# Patient Record
Sex: Female | Born: 1960 | Race: White | Hispanic: No | Marital: Married | Smoking: Never smoker
Health system: Southern US, Community
[De-identification: ages and names within clinical notes are randomized; demographics above are authoritative.]

## PROBLEM LIST (undated history)

## (undated) DIAGNOSIS — Z460 Encounter for fitting and adjustment of spectacles and contact lenses: Secondary | ICD-10-CM

## (undated) DIAGNOSIS — I10 Essential (primary) hypertension: Secondary | ICD-10-CM

## (undated) HISTORY — PX: DIAGNOSTIC LAPAROSCOPY: SUR761

## (undated) HISTORY — PX: WISDOM TOOTH EXTRACTION: SHX21

## (undated) HISTORY — PX: DILATION AND CURETTAGE OF UTERUS: SHX78

## (undated) HISTORY — PX: OTHER SURGICAL HISTORY: SHX169

---

## 1998-06-12 ENCOUNTER — Emergency Department (HOSPITAL_COMMUNITY): Admission: EM | Admit: 1998-06-12 | Discharge: 1998-06-13 | Payer: Self-pay | Admitting: Emergency Medicine

## 2001-06-15 ENCOUNTER — Emergency Department (HOSPITAL_COMMUNITY): Admission: EM | Admit: 2001-06-15 | Discharge: 2001-06-15 | Payer: Self-pay | Admitting: Emergency Medicine

## 2007-05-17 ENCOUNTER — Emergency Department (HOSPITAL_COMMUNITY): Admission: EM | Admit: 2007-05-17 | Discharge: 2007-05-17 | Payer: Self-pay | Admitting: Emergency Medicine

## 2007-12-11 ENCOUNTER — Other Ambulatory Visit: Admission: RE | Admit: 2007-12-11 | Discharge: 2007-12-11 | Payer: Self-pay | Admitting: *Deleted

## 2011-01-18 ENCOUNTER — Other Ambulatory Visit: Payer: Self-pay | Admitting: Family Medicine

## 2011-01-20 ENCOUNTER — Other Ambulatory Visit: Payer: Self-pay

## 2011-01-20 ENCOUNTER — Ambulatory Visit
Admission: RE | Admit: 2011-01-20 | Discharge: 2011-01-20 | Disposition: A | Discharge status: Home / Self Care | Payer: BC Managed Care – PPO | Source: Ambulatory Visit | Attending: Family Medicine | Admitting: Family Medicine

## 2011-01-26 ENCOUNTER — Ambulatory Visit
Admission: RE | Admit: 2011-01-26 | Discharge: 2011-01-26 | Disposition: A | Discharge status: Home / Self Care | Payer: BC Managed Care – PPO | Source: Ambulatory Visit | Attending: Family Medicine | Admitting: Family Medicine

## 2011-01-26 ENCOUNTER — Other Ambulatory Visit: Payer: Self-pay | Admitting: Family Medicine

## 2011-01-26 DIAGNOSIS — R109 Unspecified abdominal pain: Secondary | ICD-10-CM

## 2011-01-27 ENCOUNTER — Other Ambulatory Visit (HOSPITAL_COMMUNITY): Payer: Self-pay | Admitting: Family Medicine

## 2011-01-27 DIAGNOSIS — R1011 Right upper quadrant pain: Secondary | ICD-10-CM

## 2011-01-27 DIAGNOSIS — K828 Other specified diseases of gallbladder: Secondary | ICD-10-CM

## 2011-01-27 DIAGNOSIS — R109 Unspecified abdominal pain: Secondary | ICD-10-CM

## 2011-02-09 ENCOUNTER — Encounter (HOSPITAL_COMMUNITY)
Admission: RE | Admit: 2011-02-09 | Discharge: 2011-02-09 | Disposition: A | Discharge status: Home / Self Care | Payer: BC Managed Care – PPO | Source: Ambulatory Visit | Attending: Family Medicine | Admitting: Family Medicine

## 2011-02-09 ENCOUNTER — Encounter (HOSPITAL_COMMUNITY): Payer: Self-pay

## 2011-02-09 DIAGNOSIS — R109 Unspecified abdominal pain: Secondary | ICD-10-CM

## 2011-02-09 DIAGNOSIS — R1011 Right upper quadrant pain: Secondary | ICD-10-CM | POA: Insufficient documentation

## 2011-02-09 DIAGNOSIS — K828 Other specified diseases of gallbladder: Secondary | ICD-10-CM

## 2011-02-09 MED ORDER — TECHNETIUM TC 99M MEBROFENIN IV KIT
5.5000 | PACK | Freq: Once | INTRAVENOUS | Status: AC | PRN
  Administered 2011-02-09: 5.5 via INTRAVENOUS

## 2011-02-09 MED ORDER — SINCALIDE 5 MCG IJ SOLR: 0.0200 ug/kg | Freq: Once | INTRAMUSCULAR | Status: DC

## 2011-08-03 ENCOUNTER — Other Ambulatory Visit: Payer: Self-pay | Admitting: Obstetrics and Gynecology

## 2011-08-03 DIAGNOSIS — Z1231 Encounter for screening mammogram for malignant neoplasm of breast: Secondary | ICD-10-CM

## 2011-08-23 ENCOUNTER — Ambulatory Visit
Admission: RE | Admit: 2011-08-23 | Discharge: 2011-08-23 | Disposition: A | Discharge status: Home / Self Care | Payer: BC Managed Care – PPO | Source: Ambulatory Visit | Attending: Obstetrics and Gynecology | Admitting: Obstetrics and Gynecology

## 2011-08-23 DIAGNOSIS — Z1231 Encounter for screening mammogram for malignant neoplasm of breast: Secondary | ICD-10-CM

## 2011-08-24 LAB — POCT I-STAT CREATININE
Creatinine, Ser: 0.8
Operator id: 285841

## 2011-08-24 LAB — I-STAT 8, (EC8 V) (CONVERTED LAB)
BUN: 13
Chloride: 104
Glucose, Bld: 89
Hemoglobin: 12.6
Potassium: 4
Sodium: 138

## 2011-08-24 LAB — LIPASE, BLOOD: Lipase: 30

## 2011-08-24 LAB — CBC
HCT: 34.5 — ABNORMAL LOW
MCV: 87.8
Platelets: 502 — ABNORMAL HIGH
RDW: 13.4

## 2011-08-24 LAB — DIFFERENTIAL
Basophils Absolute: 0
Eosinophils Absolute: 0.2
Eosinophils Relative: 3
Neutrophils Relative %: 65

## 2011-09-08 ENCOUNTER — Other Ambulatory Visit: Payer: Self-pay | Admitting: Obstetrics and Gynecology

## 2011-09-09 HISTORY — PX: ABDOMINAL HYSTERECTOMY: SHX81

## 2011-09-24 ENCOUNTER — Encounter (HOSPITAL_COMMUNITY): Payer: Self-pay

## 2011-09-27 ENCOUNTER — Encounter (HOSPITAL_COMMUNITY)
Admission: RE | Admit: 2011-09-27 | Discharge: 2011-09-27 | Disposition: A | Discharge status: Home / Self Care | Payer: BC Managed Care – PPO | Source: Ambulatory Visit | Attending: Obstetrics and Gynecology | Admitting: Obstetrics and Gynecology

## 2011-09-27 ENCOUNTER — Encounter (HOSPITAL_COMMUNITY): Payer: Self-pay

## 2011-09-27 HISTORY — DX: Essential (primary) hypertension: I10

## 2011-09-27 LAB — SURGICAL PCR SCREEN
MRSA, PCR: NEGATIVE
Staphylococcus aureus: POSITIVE — AB

## 2011-09-27 LAB — CBC
MCV: 87.8 fL (ref 78.0–100.0)
Platelets: 442 10*3/uL — ABNORMAL HIGH (ref 150–400)
RDW: 14.7 % (ref 11.5–15.5)
WBC: 10.9 10*3/uL — ABNORMAL HIGH (ref 4.0–10.5)

## 2011-09-27 NOTE — Patient Instructions (Signed)
   Your procedure is scheduled on: Thursday, Nov 29th  Enter through the Main Entrance of [facility] at: 12:00 noon Pick up the phone at the desk and dial [phone] and inform us of your arrival.  Please call this number if you have any problems the morning of surgery: [phone]  Remember: Do not eat food after midnight: Wednesday Do not drink clear liquids after: 9:30am Thursday Take these medicines the morning of surgery with a SIP OF WATER:  Do not wear jewelry, make-up, or FINGER nail polish Do not wear lotions, powders, or perfumes.  You may not  wear deodorant. Do not shave 48 hours prior to surgery. Do not bring valuables to the hospital.  Leave suitcase in the car. After Surgery it may be brought to your room. For patients being admitted to the hospital, checkout time is 11:00am the day of discharge.  Patients discharged on the day of surgery will not be allowed to drive home.    Remember to use your hibiclens as instructed.Please shower with 1/2 bottle the evening before your surgery and the other 1/2 bottle the morning of surgery.

## 2011-09-27 NOTE — Pre-Procedure Instructions (Signed)
Ok to see patient DOS. 

## 2011-10-05 NOTE — H&P (Signed)
NAME:  [name], [name]                    ACCOUNT NO.:  MEDICAL RECORD NO.:  [id number]  LOCATION:                                 FACILITY:  PHYSICIAN:  Dois Davenport A. Rivard, M.D. DATE OF BIRTH:  Feb 27, 1961  DATE OF ADMISSION: DATE OF DISCHARGE:                             HISTORY & PHYSICAL   HISTORY OF PRESENT ILLNESS:  Ms. Holdsworth is a 50 year old married white female, para 0-0-1-0, presenting for a robot-assisted supracervical hysterectomy because of symptomatic uterine fibroids and menorrhagia. The patient states that she has always had "heavy menstrual periods," but starting in April 2012, her menses became much heavier.  Over the course of the past 6 months, the patient's flow may last from 7 days to 3 weeks, requiring her to change a pad hourly on the heaviest days. Typically, the patient may use up to 8 pads per day, experience significant pelvic and lower back pain that she rates as an 8/10 on a 10- point pain scale.  Fortunately, the patient is able to find relief with ibuprofen 800 mg, which will decrease her discomfort to a 5/10 on a 10- point pain scale.  She reports significant clots with her menstrual flow that will often soil her clothes.  She denies however any dyspareunia, urinary incontinence, changes in her bowel habits, vaginitis symptoms, though she does experience significant pelvic pressure and urinary frequency.  The patient was placed on hormonal therapy for management of her symptoms short-term; however, she had no relief with that therapy. In March 2012, the patient had a CT scan of her abdomen and pelvis that showed a 9-cm fundal fibroid and a left ovarian cyst.  A followup pelvic ultrasound in September 2012, showed a uterus measuring 8.45 x 4.92 x 5.21 cm with a fundal posterior pedunculated fibroid measuring 9.5 x 9.1 x 8.1 cm.  The patient also had a left ovary with a simple follicle measuring 1.3 x 1.3 x 1.2 cm and her right ovary was within  normal limits.  An endometrial biopsy done at the same time returned benign, proliferative endometrium with breakdown, benign endocervical mucosa and did not reveal any hyperplasia or malignancy.  The patient's hemoglobin was 11.8.  A review of both medical and surgical management options were given to the patient; however, due to the protracted and disruptive nature of her symptoms and the size of her uterine fibroid, the patient has decided to proceed with definitive therapy in the form of hysterectomy.  In exploring her hysterectomy options,  the patient was referred by Dr. Gerald Leitz for consideration of a robotic hysterectomy, and the patient has decided to proceed with that approach.  PAST MEDICAL HISTORY:  OB History:  Gravida 1, para 0-0-1-0.  GYN History:  Menarche 50 years old.  Last menstrual period September 09, 2010.  She denies any history of sexually transmitted diseases or abnormal Pap smears.  The patient is using abstinence as a method of contraception.  Her last Pap smear, which was normal was September 2012.  Medical History:  Positive for depression, asthma, and hypertension.  SURGICAL HISTORY:  In the 1990s, she had bilateral knee surgery for a meniscus and ACL  repair (using arthroscopy).  In 1996, she had a D and C. She denies any problems with anesthesia or any history of blood transfusions.  FAMILY HISTORY:  Cardiovascular disease, diabetes mellitus.  HABITS:  She does not use tobacco or illicit drugs.  She does occasionally consumes alcohol.  SOCIAL HISTORY:  The patient is married and she works as a Database administrator.  CURRENT MEDICATIONS:  Ibuprofen 800 mg every 8 hours with food as needed, mupirocin nasal cream for staph.  She is taking labetalol 200 mg twice daily.  ALLERGIES:  The patient has no known drug allergies, though she is sensitive to Zoloft.  It causes  headache.  Cymbalta and citalopram are ineffective therapies for her.  She denies any  sensitivities to latex, soy, peanuts, or shellfish.  REVIEW OF SYSTEMS:  The patient wear glasses.  She denies any chest pain, shortness of breath, headache, vision changes, dizziness, generalized weakness, nausea, vomiting, diarrhea, difficulty swallowing, dysuria, hematuria, urinary incontinence though she does have urinary frequency.  She denies any bright red blood per rectum, any myalgias, arthralgias, skin rashes and except as is mentioned in history of present illness, the patient's review of systems is otherwise negative.  PHYSICAL EXAMINATION:  VITAL SIGNS:  Blood pressure was 158/100, repeat blood pressure 158/96 (after being placed on labetalol 200 mg twice daily the patient's blood pressure was re-evaluated 1 week later and it was 140/92), pulse 76, respirations 16, temperature 98.5 degrees Fahrenheit orally.  Weight 282 pounds.  Height 5 feet, 8 inches tall. Body mass index 44.2. NECK:  Supple without masses.  There is no thyromegaly or cervical adenopathy. HEART:  Regular rate and rhythm. LUNGS:  Clear. BACK:  No CVA tenderness. ABDOMEN:  No tenderness, guarding, rebound, or organomegaly though there is a firm palpable mass arising from the pelvis that is approximately 4 fingerbreadths below the umbilicus.  EXTREMITIES:  No clubbing, cyanosis, or edema. PELVIC:  EGBUS is normal.  Vagina is normal though there is scant blood in the vaginal vault.  Cervix is nontender without lesions.  Uterus appears 18-20 week size, it is without tenderness.  Adnexa without tenderness or separable masses.  IMPRESSION: 1. Symptomatic uterine fibroids. 2. Menorrhagia. 3. Hypertension.  DISPOSITION:  A discussion was held with the patient regarding indications for her procedure along with its risks, which include, but are not limited to reaction to anesthesia, damage to adjacent organs, infection, and excessive bleeding.  It was further reviewed with the patient that the robotic  approach requires more time than that of an open abdominal incision approach.  She was advised that she will experience transient postoperative facial edema, that an open abdominal incision may be necessary, that her hospital stay is expected to be 0-2 days.  With the exception of intercourse, she should be able to return to her usual activities within 2-3 weeks.  The patient was given a MiraLax bowel prep to be completed 24 hours prior to her procedure.  The patient verbalized understanding of these risks and instructions and has consented to proceed with a robot-assisted supracervical hysterectomy at [facility] on October 07, 2011 at 1:30 p.m.     Lizzeth Meder J. Lowell Guitar, P.A.-C   ______________________________ Crist Fat Rivard, M.D.    EJP/MEDQ  D:  10/05/2011  T:  10/05/2011  Job:  [id number]

## 2011-10-06 MED ORDER — DEXTROSE 5 % IV SOLN
2.0000 g | INTRAVENOUS | Status: AC
  Administered 2011-10-07: 2 g via INTRAVENOUS
  Filled 2011-10-06: qty 2

## 2011-10-07 ENCOUNTER — Encounter (HOSPITAL_COMMUNITY)
Admission: RE | Disposition: A | Discharge status: Home / Self Care | Payer: Self-pay | Source: Ambulatory Visit | Attending: Obstetrics and Gynecology

## 2011-10-07 ENCOUNTER — Encounter (HOSPITAL_COMMUNITY): Payer: Self-pay | Admitting: Anesthesiology

## 2011-10-07 ENCOUNTER — Other Ambulatory Visit: Payer: Self-pay | Admitting: Obstetrics and Gynecology

## 2011-10-07 ENCOUNTER — Ambulatory Visit (HOSPITAL_COMMUNITY)
Admission: RE | Admit: 2011-10-07 | Discharge: 2011-10-07 | Disposition: A | Discharge status: Home / Self Care | Payer: BC Managed Care – PPO | Source: Ambulatory Visit | Attending: Obstetrics and Gynecology | Admitting: Obstetrics and Gynecology

## 2011-10-07 ENCOUNTER — Ambulatory Visit (HOSPITAL_COMMUNITY): Payer: BC Managed Care – PPO | Admitting: Anesthesiology

## 2011-10-07 ENCOUNTER — Encounter (HOSPITAL_COMMUNITY): Payer: Self-pay | Admitting: *Deleted

## 2011-10-07 DIAGNOSIS — D259 Leiomyoma of uterus, unspecified: Secondary | ICD-10-CM | POA: Insufficient documentation

## 2011-10-07 DIAGNOSIS — Z01812 Encounter for preprocedural laboratory examination: Secondary | ICD-10-CM | POA: Insufficient documentation

## 2011-10-07 DIAGNOSIS — Z01818 Encounter for other preprocedural examination: Secondary | ICD-10-CM | POA: Insufficient documentation

## 2011-10-07 DIAGNOSIS — N920 Excessive and frequent menstruation with regular cycle: Secondary | ICD-10-CM | POA: Insufficient documentation

## 2011-10-07 DIAGNOSIS — I10 Essential (primary) hypertension: Secondary | ICD-10-CM | POA: Insufficient documentation

## 2011-10-07 LAB — PREGNANCY, URINE: Preg Test, Ur: NEGATIVE

## 2011-10-07 SURGERY — ROBOTIC ASSISTED TOTAL HYSTERECTOMY
Anesthesia: General | Site: Abdomen | Wound class: Clean Contaminated

## 2011-10-07 MED ORDER — KETOROLAC TROMETHAMINE 30 MG/ML IJ SOLN
INTRAMUSCULAR | Status: DC | PRN
  Administered 2011-10-07: 30 mg via INTRAVENOUS

## 2011-10-07 MED ORDER — ALBUTEROL SULFATE HFA 108 (90 BASE) MCG/ACT IN AERS
2.0000 | INHALATION_SPRAY | Freq: Four times a day (QID) | RESPIRATORY_TRACT | Status: DC | PRN
  Filled 2011-10-07: qty 6.7

## 2011-10-07 MED ORDER — OXYCODONE-ACETAMINOPHEN 5-500 MG PO CAPS: 1.0000 | ORAL_CAPSULE | ORAL | Status: DC | PRN

## 2011-10-07 MED ORDER — SUFENTANIL CITRATE 50 MCG/ML IV SOLN
INTRAVENOUS | Status: AC
  Filled 2011-10-07: qty 1

## 2011-10-07 MED ORDER — DEXAMETHASONE SODIUM PHOSPHATE 10 MG/ML IJ SOLN
INTRAMUSCULAR | Status: AC
  Filled 2011-10-07: qty 1

## 2011-10-07 MED ORDER — PROPOFOL 10 MG/ML IV EMUL
INTRAVENOUS | Status: AC
  Filled 2011-10-07: qty 40

## 2011-10-07 MED ORDER — IBUPROFEN 600 MG PO TABS: 600.0000 mg | ORAL_TABLET | Freq: Four times a day (QID) | ORAL | Status: AC | PRN

## 2011-10-07 MED ORDER — DEXAMETHASONE SODIUM PHOSPHATE 10 MG/ML IJ SOLN
INTRAMUSCULAR | Status: DC | PRN
  Administered 2011-10-07: 10 mg via INTRAVENOUS

## 2011-10-07 MED ORDER — SUFENTANIL CITRATE 50 MCG/ML IV SOLN
INTRAVENOUS | Status: DC | PRN
  Administered 2011-10-07: 20 ug via INTRAVENOUS
  Administered 2011-10-07 (×3): 10 ug via INTRAVENOUS

## 2011-10-07 MED ORDER — LIDOCAINE HCL (CARDIAC) 20 MG/ML IV SOLN
INTRAVENOUS | Status: DC | PRN
  Administered 2011-10-07: 100 mg via INTRAVENOUS

## 2011-10-07 MED ORDER — BUPIVACAINE HCL (PF) 0.25 % IJ SOLN
INTRAMUSCULAR | Status: DC | PRN
  Administered 2011-10-07: 13 mL

## 2011-10-07 MED ORDER — GLYCOPYRROLATE 0.2 MG/ML IJ SOLN
INTRAMUSCULAR | Status: DC | PRN
  Administered 2011-10-07: .4 mg via INTRAVENOUS

## 2011-10-07 MED ORDER — GLYCOPYRROLATE 0.2 MG/ML IJ SOLN
INTRAMUSCULAR | Status: AC
  Filled 2011-10-07: qty 2

## 2011-10-07 MED ORDER — PROMETHAZINE HCL 25 MG PO TABS: 12.5000 mg | ORAL_TABLET | Freq: Four times a day (QID) | ORAL | Status: DC | PRN

## 2011-10-07 MED ORDER — KETOROLAC TROMETHAMINE 60 MG/2ML IM SOLN
INTRAMUSCULAR | Status: AC
  Filled 2011-10-07: qty 2

## 2011-10-07 MED ORDER — MENTHOL 3 MG MT LOZG: 1.0000 | LOZENGE | OROMUCOSAL | Status: DC | PRN

## 2011-10-07 MED ORDER — FENTANYL CITRATE 0.05 MG/ML IJ SOLN
25.0000 ug | INTRAMUSCULAR | Status: DC | PRN
  Administered 2011-10-07 (×2): 50 ug via INTRAVENOUS

## 2011-10-07 MED ORDER — KETOROLAC TROMETHAMINE 60 MG/2ML IM SOLN
INTRAMUSCULAR | Status: DC | PRN
  Administered 2011-10-07: 30 mg via INTRAMUSCULAR

## 2011-10-07 MED ORDER — ONDANSETRON HCL 4 MG/2ML IJ SOLN
INTRAMUSCULAR | Status: DC | PRN
  Administered 2011-10-07: 4 mg via INTRAVENOUS

## 2011-10-07 MED ORDER — ROCURONIUM BROMIDE 100 MG/10ML IV SOLN
INTRAVENOUS | Status: DC | PRN
  Administered 2011-10-07: 10 mg via INTRAVENOUS
  Administered 2011-10-07: 50 mg via INTRAVENOUS
  Administered 2011-10-07 (×2): 10 mg via INTRAVENOUS

## 2011-10-07 MED ORDER — LACTATED RINGERS IR SOLN
Status: DC | PRN
  Administered 2011-10-07: 3000 mL

## 2011-10-07 MED ORDER — IBUPROFEN 600 MG PO TABS: 600.0000 mg | ORAL_TABLET | Freq: Four times a day (QID) | ORAL | Status: DC | PRN

## 2011-10-07 MED ORDER — OXYCODONE-ACETAMINOPHEN 5-325 MG PO TABS
1.0000 | ORAL_TABLET | ORAL | Status: DC | PRN
  Administered 2011-10-07: 2 via ORAL
  Filled 2011-10-07: qty 2

## 2011-10-07 MED ORDER — ALBUTEROL SULFATE HFA 108 (90 BASE) MCG/ACT IN AERS
INHALATION_SPRAY | RESPIRATORY_TRACT | Status: AC
  Administered 2011-10-07: 2 via RESPIRATORY_TRACT
  Filled 2011-10-07: qty 6.7

## 2011-10-07 MED ORDER — LABETALOL HCL 200 MG PO TABS
400.0000 mg | ORAL_TABLET | Freq: Two times a day (BID) | ORAL | Status: DC
  Administered 2011-10-07: 400 mg via ORAL
  Filled 2011-10-07 (×2): qty 2

## 2011-10-07 MED ORDER — FENTANYL CITRATE 0.05 MG/ML IJ SOLN
INTRAMUSCULAR | Status: AC
  Administered 2011-10-07: 50 ug via INTRAVENOUS
  Filled 2011-10-07: qty 2

## 2011-10-07 MED ORDER — ROCURONIUM BROMIDE 50 MG/5ML IV SOLN
INTRAVENOUS | Status: AC
  Filled 2011-10-07: qty 1

## 2011-10-07 MED ORDER — LIDOCAINE HCL (CARDIAC) 20 MG/ML IV SOLN
INTRAVENOUS | Status: AC
  Filled 2011-10-07: qty 5

## 2011-10-07 MED ORDER — ONDANSETRON HCL 4 MG/2ML IJ SOLN
INTRAMUSCULAR | Status: AC
  Filled 2011-10-07: qty 2

## 2011-10-07 MED ORDER — MIDAZOLAM HCL 5 MG/5ML IJ SOLN
INTRAMUSCULAR | Status: DC | PRN
  Administered 2011-10-07: 2 mg via INTRAVENOUS

## 2011-10-07 MED ORDER — PROPOFOL 10 MG/ML IV EMUL
INTRAVENOUS | Status: DC | PRN
  Administered 2011-10-07: 70 mg via INTRAVENOUS
  Administered 2011-10-07: 180 mg via INTRAVENOUS

## 2011-10-07 MED ORDER — ARTIFICIAL TEARS OP OINT
TOPICAL_OINTMENT | OPHTHALMIC | Status: DC | PRN
  Administered 2011-10-07: 1 via OPHTHALMIC

## 2011-10-07 MED ORDER — NEOSTIGMINE METHYLSULFATE 1 MG/ML IJ SOLN
INTRAMUSCULAR | Status: AC
  Filled 2011-10-07: qty 10

## 2011-10-07 MED ORDER — NEOSTIGMINE METHYLSULFATE 1 MG/ML IJ SOLN
INTRAMUSCULAR | Status: DC | PRN
  Administered 2011-10-07: 2 mg via INTRAVENOUS

## 2011-10-07 MED ORDER — LACTATED RINGERS IV SOLN
INTRAVENOUS | Status: DC
  Administered 2011-10-07: 14:00:00 via INTRAVENOUS
  Administered 2011-10-07: 125 mL/h via INTRAVENOUS
  Administered 2011-10-07 (×2): via INTRAVENOUS

## 2011-10-07 MED ORDER — MIDAZOLAM HCL 2 MG/2ML IJ SOLN
INTRAMUSCULAR | Status: AC
  Filled 2011-10-07: qty 2

## 2011-10-07 SURGICAL SUPPLY — 68 items
BAG URINE DRAINAGE (UROLOGICAL SUPPLIES) ×4 IMPLANT
BARRIER ADHS 3X4 INTERCEED (GAUZE/BANDAGES/DRESSINGS) ×2 IMPLANT
BENZOIN TINCTURE PRP APPL 2/3 (GAUZE/BANDAGES/DRESSINGS) ×2 IMPLANT
BLADELESS LONG 8MM (BLADE) ×2 IMPLANT
CABLE HIGH FREQUENCY MONO STRZ (ELECTRODE) ×2 IMPLANT
CATH FOLEY 3WAY  5CC 16FR (CATHETERS) ×1
CATH FOLEY 3WAY  5CC 18FR (CATHETERS)
CATH FOLEY 3WAY 5CC 16FR (CATHETERS) ×1 IMPLANT
CATH FOLEY 3WAY 5CC 18FR (CATHETERS) IMPLANT
CHLORAPREP W/TINT 26ML (MISCELLANEOUS) ×2 IMPLANT
CLOTH BEACON ORANGE TIMEOUT ST (SAFETY) ×2 IMPLANT
CONT PATH 16OZ SNAP LID 3702 (MISCELLANEOUS) ×2 IMPLANT
CORDS BIPOLAR (ELECTRODE) ×2 IMPLANT
COVER MAYO STAND STRL (DRAPES) ×2 IMPLANT
COVER TABLE BACK 60X90 (DRAPES) ×4 IMPLANT
COVER TIP SHEARS 8 DVNC (MISCELLANEOUS) ×1 IMPLANT
COVER TIP SHEARS 8MM DA VINCI (MISCELLANEOUS) ×1
DECANTER SPIKE VIAL GLASS SM (MISCELLANEOUS) ×2 IMPLANT
DERMABOND ADVANCED (GAUZE/BANDAGES/DRESSINGS) ×3
DERMABOND ADVANCED .7 DNX12 (GAUZE/BANDAGES/DRESSINGS) ×3 IMPLANT
DRAPE HUG U DISPOSABLE (DRAPE) ×2 IMPLANT
DRAPE LG THREE QUARTER DISP (DRAPES) ×4 IMPLANT
DRAPE MONITOR DA VINCI (DRAPE) ×2 IMPLANT
DRAPE WARM FLUID 44X44 (DRAPE) ×2 IMPLANT
ELECT REM PT RETURN 9FT ADLT (ELECTROSURGICAL) ×2
ELECTRODE REM PT RTRN 9FT ADLT (ELECTROSURGICAL) ×1 IMPLANT
EVACUATOR SMOKE 8.L (FILTER) ×2 IMPLANT
GAUZE VASELINE 3X9 (GAUZE/BANDAGES/DRESSINGS) IMPLANT
GLOVE BIOGEL PI IND STRL 7.0 (GLOVE) ×3 IMPLANT
GLOVE BIOGEL PI INDICATOR 7.0 (GLOVE) ×3
GLOVE ECLIPSE 6.5 STRL STRAW (GLOVE) ×6 IMPLANT
GOWN PREVENTION PLUS LG XLONG (DISPOSABLE) ×8 IMPLANT
GRASPER BIPOLAR FEN DA VINCI (INSTRUMENTS)
GRASPER BPLR FEN DVNC (INSTRUMENTS) IMPLANT
KIT DISP ACCESSORY 4 ARM (KITS) ×2 IMPLANT
NS IRRIG 1000ML POUR BTL (IV SOLUTION) IMPLANT
OCCLUDER COLPOPNEUMO (BALLOONS) ×2 IMPLANT
PACK LAVH (CUSTOM PROCEDURE TRAY) ×2 IMPLANT
PAD PREP 24X48 CUFFED NSTRL (MISCELLANEOUS) ×4 IMPLANT
PLUG CATH AND CAP STER (CATHETERS) ×2 IMPLANT
POSITIONER SURGICAL ARM (MISCELLANEOUS) ×4 IMPLANT
RETRACT II ENDO 10MM 32CML (ENDOMECHANICALS) ×2
RETRACTOR II ENDO 10MM 32CML (ENDOMECHANICALS) ×1 IMPLANT
SET CYSTO W/LG BORE CLAMP LF (SET/KITS/TRAYS/PACK) IMPLANT
SET IRRIG TUBING LAPAROSCOPIC (IRRIGATION / IRRIGATOR) ×2 IMPLANT
SOLUTION ELECTROLUBE (MISCELLANEOUS) ×2 IMPLANT
SPONGE LAP 18X18 X RAY DECT (DISPOSABLE) IMPLANT
STRIP CLOSURE SKIN 1/2X4 (GAUZE/BANDAGES/DRESSINGS) ×2 IMPLANT
SUT MNCRL AB 3-0 PS2 27 (SUTURE) IMPLANT
SUT VIC AB 0 CT1 27 (SUTURE) ×4
SUT VIC AB 0 CT1 27XBRD ANTBC (SUTURE) ×4 IMPLANT
SUT VIC AB 0 CT2 27 (SUTURE) IMPLANT
SUT VIC AB 2-0 CT2 27 (SUTURE) ×2 IMPLANT
SUT VICRYL 0 UR6 27IN ABS (SUTURE) ×4 IMPLANT
SYR 50ML LL SCALE MARK (SYRINGE) ×2 IMPLANT
SYSTEM CONVERTIBLE TROCAR (TROCAR) ×2 IMPLANT
TIP UTERINE 5.1X6CM LAV DISP (MISCELLANEOUS) IMPLANT
TIP UTERINE 6.7X10CM GRN DISP (MISCELLANEOUS) IMPLANT
TIP UTERINE 6.7X6CM WHT DISP (MISCELLANEOUS) IMPLANT
TIP UTERINE 6.7X8CM BLUE DISP (MISCELLANEOUS) IMPLANT
TOWEL OR 17X24 6PK STRL BLUE (TOWEL DISPOSABLE) ×6 IMPLANT
TROCAR 12M 150ML BLUNT (TROCAR) ×2 IMPLANT
TROCAR DISP BLADELESS 8 DVNC (TROCAR) ×1 IMPLANT
TROCAR DISP BLADELESS 8MM (TROCAR) ×1
TROCAR XCEL 12X100 BLDLESS (ENDOMECHANICALS) ×2 IMPLANT
TROCAR Z-THREAD BLADED 12X100M (TROCAR) IMPLANT
TUBING FILTER THERMOFLATOR (ELECTROSURGICAL) ×2 IMPLANT
WARMER LAPAROSCOPE (MISCELLANEOUS) ×2 IMPLANT

## 2011-10-07 NOTE — Interval H&P Note (Signed)
History and Physical Interval Note:  10/07/2011 1:29 PM  [name]  has presented today for surgery, with the diagnosis of Fibroids  The various methods of treatment have been discussed with the patient and family. After consideration of risks, benefits and other options for treatment, the patient has consented to  Procedure(s): ROBOTIC ASSISTED SUPRACERVICAL  HYSTERECTOMY as a surgical intervention .  The patients' history has been reviewed, patient examined, no change in status, stable for surgery.  I have reviewed the patients' chart and labs.  Questions were answered to the patient's satisfaction.     [name] A

## 2011-10-07 NOTE — Anesthesia Postprocedure Evaluation (Signed)
Anesthesia Post Note  Patient: [name]  Procedure(s) Performed:  ROBOTIC ASSISTED TOTAL HYSTERECTOMY  Anesthesia type: GA  Patient location: PACU  Post pain: Pain level controlled  Post assessment: Post-op Vital signs reviewed  Last Vitals:  Filed Vitals:   10/07/11 1845  BP: 120/95  Pulse: 83  Temp: 37.2 C  Resp: 10    Post vital signs: Reviewed  Level of consciousness: sedated  Complications: No apparent anesthesia complications

## 2011-10-07 NOTE — Discharge Summary (Signed)
Physician Discharge Summary  Patient ID: [name] MRN: [id number] DOB/AGE: 1960/12/24 50 y.o.  Admit date: 10/07/2011 Discharge date: 10/07/2011  Admission Diagnoses: Fibroids  Discharge Diagnoses: Fibroids        Principal Problem:  *Leiomyoma of uterus, unspecified   Discharged Condition: Stable  Hospital Course: On date of admission, patient underwent a robot assisted laparoscopic supracervical hysterectomy tolerating procedure well. (Specimen = 486 grams). Post operative course was unremarkable with patient resuming bowel and bladder function post operatively and therefore discharged home.  Disposition: Discharged home  Current Discharge Medication List    CONTINUE these medications which have NOT CHANGED   Details  labetalol (NORMODYNE) 200 MG tablet Take 400 mg by mouth 2 (two) times daily.     albuterol (PROVENTIL HFA;VENTOLIN HFA) 108 (90 BASE) MCG/ACT inhaler Inhale 2 puffs into the lungs every 6 (six) hours as needed. Shortness of breath/asthma     ibuprofen (ADVIL,MOTRIN) 200 MG tablet Take 800 mg by mouth at bedtime. Is taking for pain         Follow-up Information    Follow up with [facility], MD on 10/21/2011. (13:30 )    Contact information:   [address] [place] [address] [phone]          Signed: Patrick Jupiter 10/07/2011, 5:37 PM

## 2011-10-07 NOTE — Progress Notes (Signed)
Pt denied pain upon arriving to floor and while resting in bed.  Pt ambulated to bathroom and got dressed around 2330.  Pt stated pain level was a 6/10 due to ambulation.  Pt requested pain medication before discharge and was given 2 percocet.

## 2011-10-07 NOTE — Brief Op Note (Signed)
10/07/2011  5:18 PM  PATIENT:  [name]  50 y.o. female  PRE-OPERATIVE DIAGNOSIS:  Fibroids  POST-OPERATIVE DIAGNOSIS:  Fibroids  PROCEDURE:  ROBOTIC ASSISTED SUPRACERVICAL HYSTERECTOMY  SURGEON: Esmeralda Arthur, MD  ASSISTANTS: Henreitta Leber PA-C   ANESTHESIA:   local and general  LOCAL MEDICATIONS USED:  MARCAINE 20 CC  SPECIMEN:  uterine body and fibroid  DISPOSITION OF SPECIMEN:  PATHOLOGY  COUNTS:  YES  ESTIMATED BLOOD LOSS: 75cc  PATIENT DISPOSITION:  PACU - hemodynamically stable.   DICTATION #: [id number]    ZOXWRU,[id number] A MD 10/07/2011 5:18 PM

## 2011-10-07 NOTE — Op Note (Signed)
[name], [name]               ACCOUNT NO.:  [id number]  MEDICAL RECORD NO.:  [id number]  LOCATION:  9308                          FACILITY:  [facility]  PHYSICIAN:  Crist Fat. Ramar Nobrega, M.D. DATE OF BIRTH:  1961-09-10  DATE OF PROCEDURE:  10/07/2011 DATE OF DISCHARGE:                              OPERATIVE REPORT   PREOPERATIVE DIAGNOSIS:  Symptomatic uterine fibroids.  POSTOPERATIVE DIAGNOSIS:  Symptomatic uterine fibroids.  ANESTHESIA:  General, Quillian Quince, MD.  PROCEDURES:  Robotically-assisted supracervical hysterectomy with lysis of adhesion.  SURGEON:  Crist Fat. Darsh Vandevoort, MD.  ASSISTANT:  Elmira J. Lowell Guitar, PA.  ESTIMATED BLOOD LOSS:  75 mL.  PROCEDURE IN DETAIL:  After being informed of the planned procedure with possible complications including bleeding; infection; injury to bladder, bowels, or ureters; possibility of menstrual cycle due to cervical stump; and possibility of growing fibroids from the cervical stump; informed consent was obtained.  The patient was taken to OR #7 and provided with general anesthesia with endotracheal intubation without any complication.  She was placed in a lithotomy position on a sticky mattress and beanbag with arms padded and tucked on each side, and knee- high sequential compressive devices.  She was prepped and draped in a sterile fashion and a Foley catheter was inserted in her bladder. Pelvic exam revealed an enlarged uterus, 18-20 week size, which was very mobile.  Adnexa are not felt.  A weighted speculum was inserted in the vagina.  Anterior lip of the cervix was grasped with a tenaculum forceps and the uterus was sounded at 10.5 cm.  A #10 RUMI intrauterine manipulator with a 3.0 coring were easily positioned.  Balloon was inflated with 10 mL of saline and retractors were removed.  We measured 12 cm above the fundus and infiltrated the midline area with 10 mL of Marcaine 0.25.  We performed a 12-mm vertical incision  which was brought down bluntly to the fascia.  The fascia was identified, grasped with Kocher forceps, incised with Mayo scissors, and peritoneum was entered bluntly.  A pursestring suture of 0 Vicryl was placed on the fascia and a 10-mm Hasson trocar was inserted in the abdominal cavity, held in place with the pursestring suture.  This allowed for easy insufflation of the pneumoperitoneum using warmed CO2 at a maximum pressure of 15 mmHg.  A brief evaluation of the pelvic cavity revealed a globulus uterus.  Adnexa and round ligaments were not visible at this time.  Trocar placement was discussed and determined.  Each trocar site was infiltrated with Marcaine 0.25, and under direct visualization, we placed two 8-mm robotic trocar on the left, and one 8-mm robotic trocar on the right and one 10-mm patient side assistant trocar on the right. Preparation and docking was completed in 45 minutes.  Placing a monopolar scissor in arm #1, PK Gyrus forceps in arm #2, and Cobra forceps in arm #3, we evaluated the pelvis again.  The uterus appeared normal, and we were able to see the anterior part of it. Arising from the fundus and posterior wall all the way to the junction between the body and the cervix is a large 15-cm size fibroid.  With mobilizing from  the uterine manipulator and the Cobra forceps, we were able to visualize both tubes and both ovaries, which appeared normal. We are unable to visualize the posterior cul-de-sac at this time. Starting on the left side, we cauterized the tube and sectioned it, cauterized the utero-ovarian ligament and sectioned it, cauterized the round ligament and sectioned it.  This gave [place] access to the broad ligament.  We were able to bluntly dissect the left parametrial space and opened the anterior leaf of the broad ligament all the way across the lower uterine segment, allowing [place] to safely retract bladder by developing a bladder flap using monopolar  scissors and counter pressure. This allowed [place] to descent of the bladder lower in the coring which was easily identified.  The posterior leaf of the broad ligament was then entered and descended all the way to the lower uterine segment.  Uterine vessels were identified at the level of the coring and with pressure applied to it, we were able to cauterize the uterine artery and uterine vein on that side.  We then performed the same steps on the right cauterizing the tube and sectioning it, cauterizing the utero-ovarian ligament and sectioning it, cauterizing the round ligament and sectioning it.  Entry into the broad ligament allowed [place] to dissect the right parametrial space, complete the opening of the anterior leaf of the broad ligament to finalize dissection of the bladder below the coring.  We brought down the posterior leaf of the broad ligament all the way to the cervix.  With pressure applied on the coring and visualization of the right ureter, we were able to cauterize the uterine artery in its ascending branch at the level of the coring.  This now allowed [place] to perform a supracervical hysterectomy by sectioning the body of the uterus away from the cervix using open monopolar scissors. The RUMI balloon is deflated.  The uterus was removed from the [facility] manipulator, which was then retracted vaginally allowing [place] to cauterize the endocervical canal using both bipolar and monopolar energy.  We then irrigated profusely with warm saline and noted a satisfactory hemostasis on all pedicles.  Robot was undocked.  Console time is 1 hour and 20 minutes.  The 10-mm patient side assistant trocar was removed to allow the insertion of the Storz morcellator, and we then proceeded with systematic morcellation under direct visualization over the next 40 minutes.  We concluded the procedure by irrigating and confirming satisfactory hemostasis, removing all trocars under direct visualization after  evacuating the pneumoperitoneum.  The fascia of the supraumbilical incision was closed with a pursestring suture previously placed of 0 Vicryl.  The fascia of the 10-mm patient side assistant trocar was closed with a figure-of-eight stitch of 0 Vicryl.  All incisions were then closed with subcuticular suture of 3-0 Monocryl and Dermabond.  Vaginal speculum was inserted to visualize the normal cervix with no bleeding.  The procedure was very well tolerated by the patient, who was taken to recovery room in a well and stable condition.  ESTIMATED BLOOD LOSS:  75 mL.  SPECIMEN:  Uterine body and fibroids weighing 487 g sent to Pathology.     Crist Fat Tinna Kolker, M.D.     SAR/MEDQ  D:  10/07/2011  T:  10/07/2011  Job:  [id number]

## 2011-10-07 NOTE — Op Note (Signed)
NAMEMIRTIE, [name]               ACCOUNT NO.:  [id number]  MEDICAL RECORD NO.:  [id number]  LOCATION:  9308                          FACILITY:  [facility]  PHYSICIAN:  Kelsey Fat. Jaleeya Mcnelly, M.D. DATE OF BIRTH:  08-04-1961  DATE OF PROCEDURE:  10/07/2011 DATE OF DISCHARGE:                              OPERATIVE REPORT   ADDENDUM:  Please send the copy of the operative report to Dr. Richardson Dopp.     [name], M.D.     SAR/MEDQ  D:  10/07/2011  T:  10/07/2011  Job:  [id number]

## 2011-10-07 NOTE — Anesthesia Preprocedure Evaluation (Signed)
Anesthesia Evaluation  Patient identified by MRN, date of birth, ID band Patient awake    Reviewed: Allergy & Precautions, H&P , Patient's Chart, lab work & pertinent test results, reviewed documented beta blocker date and time   History of Anesthesia Complications Negative for: history of anesthetic complications  Airway Mallampati: III TM Distance: >3 FB Neck ROM: full    Dental No notable dental hx.    Pulmonary neg pulmonary ROS, asthma ,  clear to auscultation  Pulmonary exam normal       Cardiovascular Exercise Tolerance: Good hypertension, neg cardio ROS regular Normal    Neuro/Psych Negative Neurological ROS  Negative Psych ROS   GI/Hepatic negative GI ROS, Neg liver ROS,   Endo/Other  Negative Endocrine ROSMorbid obesity  Renal/GU negative Renal ROS     Musculoskeletal   Abdominal   Peds  Hematology negative hematology ROS (+)   Anesthesia Other Findings   Reproductive/Obstetrics negative OB ROS                           Anesthesia Physical Anesthesia Plan  ASA: III  Anesthesia Plan: General   Post-op Pain Management:    Induction:   Airway Management Planned:   Additional Equipment:   Intra-op Plan:   Post-operative Plan:   Informed Consent: I have reviewed the patients History and Physical, chart, labs and discussed the procedure including the risks, benefits and alternatives for the proposed anesthesia with the patient or authorized representative who has indicated his/her understanding and acceptance.   Dental Advisory Given  Plan Discussed with: CRNA and Surgeon  Anesthesia Plan Comments:         Anesthesia Quick Evaluation  

## 2011-10-07 NOTE — Transfer of Care (Signed)
Immediate Anesthesia Transfer of Care Note  Patient: [name]  Procedure(s) Performed:  ROBOTIC ASSISTED TOTAL HYSTERECTOMY  Patient Location: PACU  Anesthesia Type: General  Level of Consciousness: alert  and oriented  Airway & Oxygen Therapy: Patient Spontanous Breathing and Patient connected to nasal cannula oxygen  Post-op Assessment: Report given to PACU RN and Post -op Vital signs reviewed and stable  Post vital signs: stable  Complications: No apparent anesthesia complications

## 2012-08-15 ENCOUNTER — Other Ambulatory Visit: Payer: Self-pay | Admitting: Obstetrics and Gynecology

## 2012-08-15 DIAGNOSIS — Z1231 Encounter for screening mammogram for malignant neoplasm of breast: Secondary | ICD-10-CM

## 2012-08-28 ENCOUNTER — Ambulatory Visit: Payer: BC Managed Care – PPO

## 2012-09-26 ENCOUNTER — Ambulatory Visit
Admission: RE | Admit: 2012-09-26 | Discharge: 2012-09-26 | Disposition: A | Discharge status: Home / Self Care | Payer: BC Managed Care – PPO | Source: Ambulatory Visit | Attending: Obstetrics and Gynecology | Admitting: Obstetrics and Gynecology

## 2012-09-26 DIAGNOSIS — Z1231 Encounter for screening mammogram for malignant neoplasm of breast: Secondary | ICD-10-CM

## 2012-11-27 ENCOUNTER — Ambulatory Visit
Admission: RE | Admit: 2012-11-27 | Discharge: 2012-11-27 | Disposition: A | Discharge status: Home / Self Care | Payer: BC Managed Care – PPO | Source: Ambulatory Visit | Attending: Family Medicine | Admitting: Family Medicine

## 2012-11-27 ENCOUNTER — Other Ambulatory Visit: Payer: Self-pay | Admitting: Family Medicine

## 2012-11-27 DIAGNOSIS — R1011 Right upper quadrant pain: Secondary | ICD-10-CM

## 2012-12-30 ENCOUNTER — Emergency Department (HOSPITAL_COMMUNITY)
Admission: EM | Admit: 2012-12-30 | Discharge: 2012-12-31 | Disposition: A | Discharge status: Home / Self Care | Payer: BC Managed Care – PPO | Attending: Emergency Medicine | Admitting: Emergency Medicine

## 2012-12-30 ENCOUNTER — Emergency Department (HOSPITAL_COMMUNITY): Payer: BC Managed Care – PPO

## 2012-12-30 ENCOUNTER — Encounter (HOSPITAL_COMMUNITY): Payer: Self-pay | Admitting: Emergency Medicine

## 2012-12-30 DIAGNOSIS — K439 Ventral hernia without obstruction or gangrene: Secondary | ICD-10-CM

## 2012-12-30 DIAGNOSIS — Z90710 Acquired absence of both cervix and uterus: Secondary | ICD-10-CM | POA: Insufficient documentation

## 2012-12-30 DIAGNOSIS — Z79899 Other long term (current) drug therapy: Secondary | ICD-10-CM | POA: Insufficient documentation

## 2012-12-30 DIAGNOSIS — I10 Essential (primary) hypertension: Secondary | ICD-10-CM | POA: Insufficient documentation

## 2012-12-30 DIAGNOSIS — J45909 Unspecified asthma, uncomplicated: Secondary | ICD-10-CM | POA: Insufficient documentation

## 2012-12-30 DIAGNOSIS — R109 Unspecified abdominal pain: Secondary | ICD-10-CM | POA: Insufficient documentation

## 2012-12-30 LAB — COMPREHENSIVE METABOLIC PANEL WITH GFR
ALT: 18 U/L (ref 0–35)
AST: 18 U/L (ref 0–37)
Albumin: 3.9 g/dL (ref 3.5–5.2)
Alkaline Phosphatase: 82 U/L (ref 39–117)
BUN: 14 mg/dL (ref 6–23)
CO2: 28 meq/L (ref 19–32)
Calcium: 9.2 mg/dL (ref 8.4–10.5)
Chloride: 101 meq/L (ref 96–112)
Creatinine, Ser: 0.68 mg/dL (ref 0.50–1.10)
GFR calc Af Amer: >90 mL/min
GFR calc non Af Amer: >90 mL/min
Glucose, Bld: 88 mg/dL (ref 70–99)
Potassium: 4.1 meq/L (ref 3.5–5.1)
Sodium: 138 meq/L (ref 135–145)
Total Bilirubin: 0.7 mg/dL (ref 0.3–1.2)
Total Protein: 8 g/dL (ref 6.0–8.3)

## 2012-12-30 LAB — URINALYSIS, ROUTINE W REFLEX MICROSCOPIC
Bilirubin Urine: NEGATIVE
Glucose, UA: NEGATIVE mg/dL
Hgb urine dipstick: NEGATIVE
Ketones, ur: NEGATIVE mg/dL
Leukocytes, UA: NEGATIVE
Nitrite: NEGATIVE
Protein, ur: NEGATIVE mg/dL
Specific Gravity, Urine: 1.012 (ref 1.005–1.030)
Urobilinogen, UA: 0.2 mg/dL (ref 0.0–1.0)
pH: 8 (ref 5.0–8.0)

## 2012-12-30 LAB — CBC WITH DIFFERENTIAL/PLATELET
Basophils Relative: 1 % (ref 0–1)
Eosinophils Absolute: 0.3 10*3/uL (ref 0.0–0.7)
Eosinophils Relative: 3 % (ref 0–5)
MCH: 29.9 pg (ref 26.0–34.0)
MCHC: 33.7 g/dL (ref 30.0–36.0)
MCV: 88.5 fL (ref 78.0–100.0)
Neutrophils Relative %: 57 % (ref 43–77)
Platelets: 467 10*3/uL — ABNORMAL HIGH (ref 150–400)

## 2012-12-30 LAB — LIPASE, BLOOD: Lipase: 49 U/L (ref 11–59)

## 2012-12-30 MED ORDER — IOHEXOL 300 MG/ML  SOLN
100.0000 mL | Freq: Once | INTRAMUSCULAR | Status: AC | PRN
  Administered 2012-12-30: 100 mL via INTRAVENOUS

## 2012-12-30 MED ORDER — HYDROCODONE-ACETAMINOPHEN 5-325 MG PO TABS
2.0000 | ORAL_TABLET | Freq: Once | ORAL | Status: AC
  Administered 2012-12-30: 2 via ORAL
  Filled 2012-12-30: qty 2

## 2012-12-30 MED ORDER — IOHEXOL 300 MG/ML  SOLN
50.0000 mL | Freq: Once | INTRAMUSCULAR | Status: AC | PRN
  Administered 2012-12-30: 50 mL via ORAL

## 2012-12-30 MED ORDER — HYDROCODONE-ACETAMINOPHEN 5-325 MG PO TABS: 2.0000 | ORAL_TABLET | ORAL | Status: DC | PRN

## 2012-12-30 MED ORDER — FENTANYL CITRATE 0.05 MG/ML IJ SOLN
100.0000 ug | Freq: Once | INTRAMUSCULAR | Status: AC
  Administered 2012-12-30: 100 ug via INTRAVENOUS
  Filled 2012-12-30: qty 2

## 2012-12-30 NOTE — ED Notes (Signed)
CT contrast completed CT notified

## 2012-12-30 NOTE — ED Provider Notes (Signed)
Medical screening examination/treatment/procedure(s) were conducted as a shared visit with non-physician practitioner(s) and myself.  I personally evaluated the patient during the encounter  Doug Sou, MD 12/30/12 [phone]

## 2012-12-30 NOTE — ED Provider Notes (Signed)
Complains of pain at mid abdomen for 3 months nonradiating. Pain is worse for the past 3 days. No treatment prior to coming. No associated nausea vomiting fever or other symptoms. On exam alert nontoxic no distress abdomen obese tender at hypogastrium. No guarding rigidity or rebound, normoactive bowel sounds Patient seen and examined by [facility] [place] [facility] Dr. Biagio Quint, who suggested that patient does not have a surgical abdomen and does not require immediate surgery for her hernia. He also suggested offer patient point there are observation, which I did. She declines. Plan prescription Norco. Followup Dr. Tiburcio Pea in next week for reexamination or return or feels worse Results for orders placed during the hospital encounter of 12/30/12  CBC WITH DIFFERENTIAL      Result Value Range   WBC 9.2  4.0 - 10.5 K/uL   RBC 4.69  3.87 - 5.11 MIL/uL   Hemoglobin 14.0  12.0 - 15.0 g/dL   HCT 40.9  81.1 - 91.4 %   MCV 88.5  78.0 - 100.0 fL   MCH 29.9  26.0 - 34.0 pg   MCHC 33.7  30.0 - 36.0 g/dL   RDW 78.2  95.6 - 21.3 %   Platelets 467 (*) 150 - 400 K/uL   Neutrophils Relative 57  43 - 77 %   Neutro Abs 5.2  1.7 - 7.7 K/uL   Lymphocytes Relative 30  12 - 46 %   Lymphs Abs 2.7  0.7 - 4.0 K/uL   Monocytes Relative 10  3 - 12 %   Monocytes Absolute 0.9  0.1 - 1.0 K/uL   Eosinophils Relative 3  0 - 5 %   Eosinophils Absolute 0.3  0.0 - 0.7 K/uL   Basophils Relative 1  0 - 1 %   Basophils Absolute 0.1  0.0 - 0.1 K/uL  COMPREHENSIVE METABOLIC PANEL      Result Value Range   Sodium 138  135 - 145 mEq/L   Potassium 4.1  3.5 - 5.1 mEq/L   Chloride 101  96 - 112 mEq/L   CO2 28  19 - 32 mEq/L   Glucose, Bld 88  70 - 99 mg/dL   BUN 14  6 - 23 mg/dL   Creatinine, Ser 0.86  0.50 - 1.10 mg/dL   Calcium 9.2  8.4 - 57.8 mg/dL   Total Protein 8.0  6.0 - 8.3 g/dL   Albumin 3.9  3.5 - 5.2 g/dL   AST 18  0 - 37 U/L   ALT 18  0 - 35 U/L   Alkaline Phosphatase 82  39 - 117 U/L   Total Bilirubin 0.7  0.3 - 1.2  mg/dL   GFR calc non Af Amer >90  >90 mL/min   GFR calc Af Amer >90  >90 mL/min  LIPASE, BLOOD      Result Value Range   Lipase 49  11 - 59 U/L  URINALYSIS, ROUTINE W REFLEX MICROSCOPIC      Result Value Range   Color, Urine YELLOW  YELLOW   APPearance CLEAR  CLEAR   Specific Gravity, Urine 1.012  1.005 - 1.030   pH 8.0  5.0 - 8.0   Glucose, UA NEGATIVE  NEGATIVE mg/dL   Hgb urine dipstick NEGATIVE  NEGATIVE   Bilirubin Urine NEGATIVE  NEGATIVE   Ketones, ur NEGATIVE  NEGATIVE mg/dL   Protein, ur NEGATIVE  NEGATIVE mg/dL   Urobilinogen, UA 0.2  0.0 - 1.0 mg/dL   Nitrite NEGATIVE  NEGATIVE   Leukocytes,  UA NEGATIVE  NEGATIVE   Ct Abdomen Pelvis W Contrast  12/30/2012  *RADIOLOGY REPORT*  Clinical Data: Lower abdominal pain.  CT ABDOMEN AND PELVIS WITH CONTRAST  Technique:  Multidetector CT imaging of the abdomen and pelvis was performed following the standard protocol during bolus administration of intravenous contrast.  Contrast: 50mL OMNIPAQUE IOHEXOL 300 MG/ML  SOLN, OMNIPAQUE IOHEXOL 300 MG/ML  SOLN  Comparison: Ultrasound abdomen 11/27/2012.  CT abdomen and pelvis 01/26/2011  Findings: The lung bases are clear.  Poor contrast bolus limits visualization of the vascular structures and solid organs on the portal venous phase images.  There is a 6 mm low attenuation structure in the spleen which appears to been present previously and probably represents a cyst or hemangioma. The liver, gallbladder, pancreas, adrenal glands, kidneys, abdominal aorta, and retroperitoneal lymph nodes are unremarkable. There is a small midline supraumbilical abdominal wall hernia containing fat.  This is new since the previous study.  The stomach, small bowel, and colon are not abnormally distended.  No free air or free fluid in the abdomen.  There is hazy infiltration in the left mid abdominal mesentery with small lymph nodes present in this area.  This could represent inflammatory or infiltrative process  such as fat necrosis or mesenteritis.  Pelvis:  There appears to be a partially uterus, suggesting possible prior partial hysterectomy.  No abnormal adnexal masses. Ovaries are not enlarged.  The bladder wall is not thickened.  No free or loculated pelvic fluid collections.  The appendix is normal.  No evidence of diverticulitis.  No significant pelvic lymphadenopathy.  Degenerative changes in the lumbar spine.  No destructive bone lesions appreciated.  The  IMPRESSION: New small midline abdominal wall hernia containing fat. Infiltration and small lymph nodes present in the left mid abdominal mesentery could represent fat necrosis or mesenteritis. No focal abscess or bowel wall thickening.   Original Report Authenticated By: Burman Nieves, M.D.      Diagnosis #1 abdominal pain #2 ventral hernia #3elevated blood pressure  Doug Sou, MD 12/30/12 [phone]

## 2012-12-30 NOTE — ED Notes (Signed)
Pt states that on she has been having abd pain for couple of months but on Wed she been having constant bilat upper quad abd pain that radiates around bilat to her shoulder blades.  Pt states she has seen GI and test for gallbladder were negative and scheduled at end of March for endoscopy and colonoscopy.

## 2012-12-30 NOTE — ED Provider Notes (Signed)
History     CSN: [id number]  Arrival date & time 12/30/12  1509   First MD Initiated Contact with Patient 12/30/12 1745      Chief Complaint  Patient presents with  . Abdominal Pain    (Consider location/radiation/quality/duration/timing/severity/associated sxs/prior treatment) HPI Patient is a 52 yo female with a history of asthma who presents with abdominal pain.  This pain has been going on for about two months and it's described as a sharp pain all the way across her upper abdomen.  It was waxing and waning but the past two days the pain as become constant.  Denies any fevers, nausea, vomiting, diarrhea or constipation.  Did get her gallbladder check a month ago and that came back negative.   Has tried tylenol and Excedrin but that has failed to give any relief.  Had a hysterectomy in 2012.    Past Medical History  Diagnosis Date  . RUQ abdominal pain   . Hypertension     borderline - no meds  . Asthma     albuterol inhaler prn - no prob as adult    Past Surgical History  Procedure Laterality Date  . Wisdom tooth extraction    . Arthroscopic knee      bilateral knees  . Dilation and curettage of uterus    . Diagnostic laparoscopy    . Abdominal hysterectomy      No family history on file.  History  Substance Use Topics  . Smoking status: Never Smoker   . Smokeless tobacco: Never Used  . Alcohol Use: Yes     Comment: weekends - beer    OB History   Grav Para Term Preterm Abortions TAB SAB Ect Mult Living                  Review of Systems All other systems negative except as documented in the HPI. All pertinent positives and negatives as reviewed in the HPI.  Allergies  Review of patient's allergies indicates no known allergies.  Home Medications   Current Outpatient Rx  Name  Route  Sig  Dispense  Refill  . albuterol (PROVENTIL HFA;VENTOLIN HFA) 108 (90 BASE) MCG/ACT inhaler   Inhalation   Inhale 2 puffs into the lungs every 6 (six) hours as  needed. Shortness of breath/asthma          . aspirin-acetaminophen-caffeine (EXCEDRIN MIGRAINE) 250-250-65 MG per tablet   Oral   Take 1 tablet by mouth every 6 (six) hours as needed for pain.           BP 164/98  Pulse 73  Temp(Src) 98.6 F (37 C) (Oral)  Resp 18  SpO2 99%  LMP 09/10/2011  Physical Exam  Nursing note and vitals reviewed. Constitutional: She is oriented to person, place, and time. She appears well-developed and well-nourished. No distress.  HENT:  Head: Normocephalic and atraumatic.  Right Ear: External ear normal.  Left Ear: External ear normal.  Eyes: Right eye exhibits no discharge. Left eye exhibits no discharge.  Neck: Normal range of motion.  Cardiovascular: Normal rate, regular rhythm and normal heart sounds.  Exam reveals no gallop and no friction rub.   No murmur heard. Pulmonary/Chest: Effort normal and breath sounds normal. No respiratory distress. She has no wheezes. She has no rales. She exhibits no tenderness.  Abdominal: Soft. Bowel sounds are normal. She exhibits no distension and no mass. There is tenderness in the right upper quadrant, epigastric area and left upper quadrant. There  is no rebound and no guarding.    Neurological: She is alert and oriented to person, place, and time.  Skin: No rash noted. She is not diaphoretic. No erythema. No pallor.  Psychiatric: She has a normal mood and affect. Her behavior is normal. Judgment and thought content normal.    ED Course  Procedures (including critical care time)  The patient has been stable here in the ER. Does have GI follow up.   MDM  MDM Reviewed: vitals, nursing note and previous chart Reviewed previous: ultrasound Interpretation: labs and CT scan            Carlyle Dolly, PA-C 12/30/12 2101

## 2012-12-30 NOTE — Consult Note (Signed)
Reason for Consult:abdominal pain Referring Physician: [name] is an 52 y.o. female.  HPI: I was asked to evaluate this patient for abdominal pain. She says that she has had a similar abdominal pain for the last few months and she has had workup of this including a negative workup for gallbladder symptoms. She has pain across her midabdomen without any particular onset or exacerbating or relieving factors. She has had this intermittently but her current pain has been worse since Wednesday which brought her to the emergency room. She has no other associated symptoms such as fevers or chills or nausea or vomiting or diarrhea or constipation. She denies any blood in the stools or melena. She is currently being evaluated by a gastroenterologist and is scheduled for upper and lower endoscopy next month.  Past Medical History  Diagnosis Date  . RUQ abdominal pain   . Hypertension     borderline - no meds  . Asthma     albuterol inhaler prn - no prob as adult    Past Surgical History  Procedure Laterality Date  . Wisdom tooth extraction    . Arthroscopic knee      bilateral knees  . Dilation and curettage of uterus    . Diagnostic laparoscopy    . Abdominal hysterectomy      No family history on file.  Social History:  reports that she has never smoked. She has never used smokeless tobacco. She reports that  drinks alcohol. She reports that she does not use illicit drugs.  Allergies: No Known Allergies  Medications: I have reviewed the patient's current medications.  Results for orders placed during the hospital encounter of 12/30/12 (from the past 48 hour(s))  CBC WITH DIFFERENTIAL     Status: Abnormal   Collection Time    12/30/12  5:20 PM      Result Value Range   WBC 9.2  4.0 - 10.5 K/uL   RBC 4.69  3.87 - 5.11 MIL/uL   Hemoglobin 14.0  12.0 - 15.0 g/dL   HCT 40.9  81.1 - 91.4 %   MCV 88.5  78.0 - 100.0 fL   MCH 29.9  26.0 - 34.0 pg   MCHC 33.7  30.0 -  36.0 g/dL   RDW 78.2  95.6 - 21.3 %   Platelets 467 (*) 150 - 400 K/uL   Neutrophils Relative 57  43 - 77 %   Neutro Abs 5.2  1.7 - 7.7 K/uL   Lymphocytes Relative 30  12 - 46 %   Lymphs Abs 2.7  0.7 - 4.0 K/uL   Monocytes Relative 10  3 - 12 %   Monocytes Absolute 0.9  0.1 - 1.0 K/uL   Eosinophils Relative 3  0 - 5 %   Eosinophils Absolute 0.3  0.0 - 0.7 K/uL   Basophils Relative 1  0 - 1 %   Basophils Absolute 0.1  0.0 - 0.1 K/uL  COMPREHENSIVE METABOLIC PANEL     Status: None   Collection Time    12/30/12  5:20 PM      Result Value Range   Sodium 138  135 - 145 mEq/L   Potassium 4.1  3.5 - 5.1 mEq/L   Chloride 101  96 - 112 mEq/L   CO2 28  19 - 32 mEq/L   Glucose, Bld 88  70 - 99 mg/dL   BUN 14  6 - 23 mg/dL   Creatinine, Ser 0.86  0.50 - 1.10  mg/dL   Calcium 9.2  8.4 - 16.1 mg/dL   Total Protein 8.0  6.0 - 8.3 g/dL   Albumin 3.9  3.5 - 5.2 g/dL   AST 18  0 - 37 U/L   ALT 18  0 - 35 U/L   Alkaline Phosphatase 82  39 - 117 U/L   Total Bilirubin 0.7  0.3 - 1.2 mg/dL   GFR calc non Af Amer >90  >90 mL/min   GFR calc Af Amer >90  >90 mL/min   Comment:            The eGFR has been calculated     using the CKD EPI equation.     This calculation has not been     validated in all clinical     situations.     eGFR's persistently     <90 mL/min signify     possible Chronic Kidney Disease.  URINALYSIS, ROUTINE W REFLEX MICROSCOPIC     Status: None   Collection Time    12/30/12  6:04 PM      Result Value Range   Color, Urine YELLOW  YELLOW   APPearance CLEAR  CLEAR   Specific Gravity, Urine 1.012  1.005 - 1.030   pH 8.0  5.0 - 8.0   Glucose, UA NEGATIVE  NEGATIVE mg/dL   Hgb urine dipstick NEGATIVE  NEGATIVE   Bilirubin Urine NEGATIVE  NEGATIVE   Ketones, ur NEGATIVE  NEGATIVE mg/dL   Protein, ur NEGATIVE  NEGATIVE mg/dL   Urobilinogen, UA 0.2  0.0 - 1.0 mg/dL   Nitrite NEGATIVE  NEGATIVE   Leukocytes, UA NEGATIVE  NEGATIVE   Comment: MICROSCOPIC NOT DONE ON URINES  WITH NEGATIVE PROTEIN, BLOOD, LEUKOCYTES, NITRITE, OR GLUCOSE <1000 mg/dL.  LIPASE, BLOOD     Status: None   Collection Time    12/30/12  6:10 PM      Result Value Range   Lipase 49  11 - 59 U/L    Ct Abdomen Pelvis W Contrast  12/30/2012  *RADIOLOGY REPORT*  Clinical Data: Lower abdominal pain.  CT ABDOMEN AND PELVIS WITH CONTRAST  Technique:  Multidetector CT imaging of the abdomen and pelvis was performed following the standard protocol during bolus administration of intravenous contrast.  Contrast: 50mL OMNIPAQUE IOHEXOL 300 MG/ML  SOLN, OMNIPAQUE IOHEXOL 300 MG/ML  SOLN  Comparison: Ultrasound abdomen 11/27/2012.  CT abdomen and pelvis 01/26/2011  Findings: The lung bases are clear.  Poor contrast bolus limits visualization of the vascular structures and solid organs on the portal venous phase images.  There is a 6 mm low attenuation structure in the spleen which appears to been present previously and probably represents a cyst or hemangioma. The liver, gallbladder, pancreas, adrenal glands, kidneys, abdominal aorta, and retroperitoneal lymph nodes are unremarkable. There is a small midline supraumbilical abdominal wall hernia containing fat.  This is new since the previous study.  The stomach, small bowel, and colon are not abnormally distended.  No free air or free fluid in the abdomen.  There is hazy infiltration in the left mid abdominal mesentery with small lymph nodes present in this area.  This could represent inflammatory or infiltrative process such as fat necrosis or mesenteritis.  Pelvis:  There appears to be a partially uterus, suggesting possible prior partial hysterectomy.  No abnormal adnexal masses. Ovaries are not enlarged.  The bladder wall is not thickened.  No free or loculated pelvic fluid collections.  The appendix is normal.  No  evidence of diverticulitis.  No significant pelvic lymphadenopathy.  Degenerative changes in the lumbar spine.  No destructive bone lesions  appreciated.  The  IMPRESSION: New small midline abdominal wall hernia containing fat. Infiltration and small lymph nodes present in the left mid abdominal mesentery could represent fat necrosis or mesenteritis. No focal abscess or bowel wall thickening.   Original Report Authenticated By: Burman Nieves, M.D.    All other review of systems negative or noncontributory except as stated in the HPI  at  Blood pressure 164/98, pulse 73, temperature 98.6 F (37 C), temperature source Oral, resp. rate 18, last menstrual period 09/10/2011, SpO2 99.00%. General appearance: alert, cooperative and no distress Resp: nonlabored Cardio: normal rate, regular GI: her abdomen is soft with mild upper abdominal tenderness greatest in the left lateral and left upper quadrant. She is nondistended and she has well-healed laparoscopy scars I do not appreciate any hernias on exam due to her body habitus. There is no evidence of any peritoneal signs. Pulses: 2+ and symmetric Skin: Skin color, texture, turgor normal. No rashes or lesions Neurologic: Grossly normal  Assessment/Plan: Abdominal pain Incisional hernia She has somewhat chronic abdominal pain with exacerbation of her chronic pain. She also has a small fat-containing ventral hernia but I do not think that this is the cause of her acute pain. She is tender on the left side of her abdomen in the area of the inflammatory change on CT scan and not as tender in the area of the hernia near the midline. She has no obstructive symptoms. I am not certain what is causing the lymphadenopathy and inflammatory change within the mesentery.  She did not have the exam concerning for intra-abdominal catastrophe such as mesenteric ischemia or internal hernia. We could certainly repair her hernia but I do not think that this requires emergent repair. I offered to repair this but I think it would be helpful to have completed the workup of her abdominal pain including endoscopies. I  do not see any need for emergent surgery at this time but I do not have a good explanation for her pain as well. She may benefit from observation to ensure that this does not increase but if she was willing to return to the emergency room if this increases then this could also probably be managed as an outpatient with continued workup.  Lodema Pilot DAVID 12/30/2012, 11:22 PM

## 2013-01-02 ENCOUNTER — Other Ambulatory Visit: Payer: Self-pay | Admitting: Obstetrics and Gynecology

## 2013-01-02 ENCOUNTER — Other Ambulatory Visit (HOSPITAL_COMMUNITY)
Admission: RE | Admit: 2013-01-02 | Discharge: 2013-01-02 | Disposition: A | Discharge status: Home / Self Care | Payer: BC Managed Care – PPO | Source: Ambulatory Visit | Attending: Obstetrics and Gynecology | Admitting: Obstetrics and Gynecology

## 2013-01-02 DIAGNOSIS — Z01419 Encounter for gynecological examination (general) (routine) without abnormal findings: Secondary | ICD-10-CM | POA: Insufficient documentation

## 2013-01-02 DIAGNOSIS — Z1151 Encounter for screening for human papillomavirus (HPV): Secondary | ICD-10-CM | POA: Insufficient documentation

## 2013-01-17 ENCOUNTER — Other Ambulatory Visit: Payer: Self-pay | Admitting: Gastroenterology

## 2013-10-23 ENCOUNTER — Encounter (HOSPITAL_BASED_OUTPATIENT_CLINIC_OR_DEPARTMENT_OTHER): Payer: Self-pay | Admitting: *Deleted

## 2013-10-23 ENCOUNTER — Other Ambulatory Visit: Payer: Self-pay | Admitting: Orthopedic Surgery

## 2013-10-23 NOTE — Progress Notes (Signed)
No labs needed-denies any snoring or sleep apnea

## 2013-10-26 NOTE — H&P (Signed)
[name] is an 52 y.o. female.   Chief Complaint: Left Knee Pain  HPI: Patient presents with a chief complaint of left knee pain for 16 days.  Patient states that she stepped wrong and noticed that her knee locked up.  It took approximately 5 minutes for the intense pain to go away and then she was able to slowly move her knee again.  Since that time she's noticed intermittent locking of the knee.  She states the pain is constant and extremely severe at times.  She describes a stabbing aching-type pain.  She is noticed weakness and states her symptoms are getting worse.  It does wake her from sleep.  It is worse with activity and work and better with rest ice and meloxicam.  She does use a hinged knee brace.  She does have a prior history of a knee arthroscopy in 1998.  Patient did have an MRI recently showing a meniscus tear.  Past Medical History  Diagnosis Date  . Asthma     albuterol inhaler prn - no prob as adult  . Hypertension     borderline - no meds  . Contact lens/glasses fitting     wears contacts or glasses    Past Surgical History  Procedure Laterality Date  . Wisdom tooth extraction    . Arthroscopic knee      bilateral knees  . Dilation and curettage of uterus    . Diagnostic laparoscopy    . Abdominal hysterectomy  11/12    History reviewed. No pertinent family history. Social History:  reports that she has never smoked. She has never used smokeless tobacco. She reports that she drinks alcohol. She reports that she does not use illicit drugs.  Allergies: No Known Allergies  No prescriptions prior to admission    No results found for this or any previous visit (from the past 48 hour(s)). No results found.  Review of Systems  Constitutional: Negative.   HENT: Negative.   Eyes: Positive for blurred vision.       Glasses  Respiratory:       Asthma  Cardiovascular: Negative.   Gastrointestinal: Negative.   Genitourinary: Negative.   Musculoskeletal:  Positive for joint pain.  Skin: Negative.   Neurological: Negative.   Endo/Heme/Allergies: Negative.   Psychiatric/Behavioral: Negative.     Height 5\' 7"  (1.702 m), weight 127.007 kg (280 lb), last menstrual period 09/10/2011. Physical Exam  Constitutional: She is oriented to person, place, and time. She appears well-developed and well-nourished.  HENT:  Head: Normocephalic and atraumatic.  Eyes: Pupils are equal, round, and reactive to light.  Neck: Normal range of motion.  Cardiovascular: Intact distal pulses.   Respiratory: Effort normal.  GI: Soft.  Musculoskeletal: She exhibits tenderness.  Patient's right knee has good strength good range of motion and no pain.  Patient's left knee has good strength.  She does have severely limited range of motion from approximately 5 to 90.  McMurray's test does cause severe pain.  She also has severe pain over the medial joint line.  She has obvious popping catching with range of motion.  Patient has brisk capillary refill and is neurovascularly intact distally.  Due to her body habitus it is difficult to test anterior drawer Lachman's.  Neurological: She is alert and oriented to person, place, and time.  Skin: Skin is warm and dry.  Psychiatric: She has a normal mood and affect. Her behavior is normal. Judgment and thought content normal.  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  This patient is also discussed with Dr. Turner Daniels.  Patient wishes to have a knee scope.  The benefits risks and complications were discussed with the patient.  She wishes to proceed with the knee scope.  A posting slip is completed and the patient is to see [name] for scheduling.  Patient is asked to followup after surgery in approximately 7-10 days.  Call with any issues.  [name] R 10/26/2013, 4:04 PM

## 2013-10-29 ENCOUNTER — Encounter (HOSPITAL_BASED_OUTPATIENT_CLINIC_OR_DEPARTMENT_OTHER)
Admission: RE | Disposition: A | Discharge status: Home / Self Care | Payer: Self-pay | Source: Ambulatory Visit | Attending: Orthopedic Surgery

## 2013-10-29 ENCOUNTER — Ambulatory Visit (HOSPITAL_BASED_OUTPATIENT_CLINIC_OR_DEPARTMENT_OTHER): Payer: BC Managed Care – PPO | Admitting: Anesthesiology

## 2013-10-29 ENCOUNTER — Encounter (HOSPITAL_BASED_OUTPATIENT_CLINIC_OR_DEPARTMENT_OTHER): Payer: BC Managed Care – PPO | Admitting: Anesthesiology

## 2013-10-29 ENCOUNTER — Ambulatory Visit (HOSPITAL_BASED_OUTPATIENT_CLINIC_OR_DEPARTMENT_OTHER)
Admission: RE | Admit: 2013-10-29 | Discharge: 2013-10-29 | Disposition: A | Discharge status: Home / Self Care | Payer: BC Managed Care – PPO | Source: Ambulatory Visit | Attending: Orthopedic Surgery | Admitting: Orthopedic Surgery

## 2013-10-29 DIAGNOSIS — M94262 Chondromalacia, left knee: Secondary | ICD-10-CM

## 2013-10-29 DIAGNOSIS — M2340 Loose body in knee, unspecified knee: Secondary | ICD-10-CM | POA: Insufficient documentation

## 2013-10-29 DIAGNOSIS — I10 Essential (primary) hypertension: Secondary | ICD-10-CM | POA: Insufficient documentation

## 2013-10-29 DIAGNOSIS — M23305 Other meniscus derangements, unspecified medial meniscus, unspecified knee: Secondary | ICD-10-CM | POA: Insufficient documentation

## 2013-10-29 DIAGNOSIS — M2240 Chondromalacia patellae, unspecified knee: Secondary | ICD-10-CM | POA: Insufficient documentation

## 2013-10-29 HISTORY — DX: Encounter for fitting and adjustment of spectacles and contact lenses: Z46.0

## 2013-10-29 HISTORY — PX: KNEE ARTHROSCOPY: SHX127

## 2013-10-29 LAB — POCT HEMOGLOBIN-HEMACUE: Hemoglobin: 15.5 g/dL — ABNORMAL HIGH (ref 12.0–15.0)

## 2013-10-29 SURGERY — ARTHROSCOPY, KNEE
Anesthesia: General | Site: Knee | Laterality: Left

## 2013-10-29 MED ORDER — DEXTROSE-NACL 5-0.45 % IV SOLN: INTRAVENOUS | Status: DC

## 2013-10-29 MED ORDER — MIDAZOLAM HCL 5 MG/5ML IJ SOLN
INTRAMUSCULAR | Status: DC | PRN
  Administered 2013-10-29: 2 mg via INTRAVENOUS

## 2013-10-29 MED ORDER — FENTANYL CITRATE 0.05 MG/ML IJ SOLN
INTRAMUSCULAR | Status: DC | PRN
  Administered 2013-10-29 (×2): 50 ug via INTRAVENOUS
  Administered 2013-10-29: 100 ug via INTRAVENOUS

## 2013-10-29 MED ORDER — FENTANYL CITRATE 0.05 MG/ML IJ SOLN
INTRAMUSCULAR | Status: AC
  Filled 2013-10-29: qty 4

## 2013-10-29 MED ORDER — CEFAZOLIN SODIUM-DEXTROSE 2-3 GM-% IV SOLR
2.0000 g | INTRAVENOUS | Status: AC
  Administered 2013-10-29: 2 g via INTRAVENOUS

## 2013-10-29 MED ORDER — ONDANSETRON HCL 4 MG/2ML IJ SOLN
INTRAMUSCULAR | Status: DC | PRN
  Administered 2013-10-29: 4 mg via INTRAVENOUS

## 2013-10-29 MED ORDER — LIDOCAINE HCL (CARDIAC) 20 MG/ML IV SOLN
INTRAVENOUS | Status: DC | PRN
  Administered 2013-10-29: 100 mg via INTRAVENOUS

## 2013-10-29 MED ORDER — HYDROMORPHONE HCL PF 1 MG/ML IJ SOLN
INTRAMUSCULAR | Status: AC
  Filled 2013-10-29: qty 1

## 2013-10-29 MED ORDER — CEFAZOLIN SODIUM-DEXTROSE 2-3 GM-% IV SOLR
INTRAVENOUS | Status: AC
  Filled 2013-10-29: qty 50

## 2013-10-29 MED ORDER — LACTATED RINGERS IV SOLN
INTRAVENOUS | Status: DC
  Administered 2013-10-29: 10 mL/h via INTRAVENOUS

## 2013-10-29 MED ORDER — ONDANSETRON HCL 4 MG PO TABS: 4.0000 mg | ORAL_TABLET | Freq: Four times a day (QID) | ORAL | Status: DC | PRN

## 2013-10-29 MED ORDER — MIDAZOLAM HCL 2 MG/2ML IJ SOLN: 1.0000 mg | INTRAMUSCULAR | Status: DC | PRN

## 2013-10-29 MED ORDER — HYDROMORPHONE HCL PF 1 MG/ML IJ SOLN
0.2500 mg | INTRAMUSCULAR | Status: DC | PRN
  Administered 2013-10-29 (×3): 0.5 mg via INTRAVENOUS

## 2013-10-29 MED ORDER — FENTANYL CITRATE 0.05 MG/ML IJ SOLN: 50.0000 ug | INTRAMUSCULAR | Status: DC | PRN

## 2013-10-29 MED ORDER — CHLORHEXIDINE GLUCONATE 4 % EX LIQD: 60.0000 mL | Freq: Once | CUTANEOUS | Status: DC

## 2013-10-29 MED ORDER — OXYCODONE HCL 5 MG PO TABS: 5.0000 mg | ORAL_TABLET | Freq: Once | ORAL | Status: DC | PRN

## 2013-10-29 MED ORDER — MIDAZOLAM HCL 2 MG/2ML IJ SOLN
INTRAMUSCULAR | Status: AC
  Filled 2013-10-29: qty 2

## 2013-10-29 MED ORDER — PROPOFOL 10 MG/ML IV BOLUS
INTRAVENOUS | Status: DC | PRN
  Administered 2013-10-29: 200 mg via INTRAVENOUS

## 2013-10-29 MED ORDER — HYDROCODONE-ACETAMINOPHEN 5-325 MG PO TABS: 1.0000 | ORAL_TABLET | ORAL | Status: AC | PRN

## 2013-10-29 MED ORDER — FENTANYL CITRATE 0.05 MG/ML IJ SOLN: 50.0000 ug | Freq: Once | INTRAMUSCULAR | Status: DC

## 2013-10-29 MED ORDER — SODIUM CHLORIDE 0.9 % IR SOLN
Status: DC | PRN
  Administered 2013-10-29: 2000 mL

## 2013-10-29 MED ORDER — METOCLOPRAMIDE HCL 5 MG/ML IJ SOLN: 5.0000 mg | Freq: Three times a day (TID) | INTRAMUSCULAR | Status: DC | PRN

## 2013-10-29 MED ORDER — EPINEPHRINE HCL 1 MG/ML IJ SOLN
INTRAMUSCULAR | Status: DC | PRN
  Administered 2013-10-29: 1 mL

## 2013-10-29 MED ORDER — OXYCODONE HCL 5 MG/5ML PO SOLN: 5.0000 mg | Freq: Once | ORAL | Status: DC | PRN

## 2013-10-29 MED ORDER — PROMETHAZINE HCL 25 MG/ML IJ SOLN: 6.2500 mg | INTRAMUSCULAR | Status: DC | PRN

## 2013-10-29 MED ORDER — LACTATED RINGERS IV SOLN
INTRAVENOUS | Status: DC
  Administered 2013-10-29: 10:00:00 via INTRAVENOUS

## 2013-10-29 MED ORDER — METOCLOPRAMIDE HCL 5 MG PO TABS: 5.0000 mg | ORAL_TABLET | Freq: Three times a day (TID) | ORAL | Status: DC | PRN

## 2013-10-29 MED ORDER — ONDANSETRON HCL 4 MG/2ML IJ SOLN: 4.0000 mg | Freq: Four times a day (QID) | INTRAMUSCULAR | Status: DC | PRN

## 2013-10-29 MED ORDER — PROPOFOL 10 MG/ML IV BOLUS
INTRAVENOUS | Status: AC
  Filled 2013-10-29: qty 20

## 2013-10-29 MED ORDER — DEXAMETHASONE SODIUM PHOSPHATE 4 MG/ML IJ SOLN
INTRAMUSCULAR | Status: DC | PRN
  Administered 2013-10-29: 10 mg via INTRAVENOUS

## 2013-10-29 SURGICAL SUPPLY — 40 items
BANDAGE ELASTIC 6 VELCRO ST LF (GAUZE/BANDAGES/DRESSINGS) ×2 IMPLANT
BLADE 4.2CUDA (BLADE) IMPLANT
BLADE CUTTER GATOR 3.5 (BLADE) IMPLANT
BLADE GREAT WHITE 4.2 (BLADE) IMPLANT
CANISTER SUCT 3000ML (MISCELLANEOUS) IMPLANT
CANISTER SUCT LVC 12 LTR MEDI- (MISCELLANEOUS) IMPLANT
CHLORAPREP W/TINT 26ML (MISCELLANEOUS) ×2 IMPLANT
DRAPE ARTHROSCOPY W/POUCH 114 (DRAPES) ×2 IMPLANT
ELECT MENISCUS 165MM 90D (ELECTRODE) IMPLANT
ELECT REM PT RETURN 9FT ADLT (ELECTROSURGICAL)
ELECTRODE REM PT RTRN 9FT ADLT (ELECTROSURGICAL) IMPLANT
GAUZE XEROFORM 1X8 LF (GAUZE/BANDAGES/DRESSINGS) ×2 IMPLANT
GLOVE BIO SURGEON STRL SZ 6.5 (GLOVE) ×2 IMPLANT
GLOVE BIO SURGEON STRL SZ7.5 (GLOVE) ×2 IMPLANT
GLOVE BIO SURGEON STRL SZ8.5 (GLOVE) ×2 IMPLANT
GLOVE BIOGEL PI IND STRL 7.0 (GLOVE) ×1 IMPLANT
GLOVE BIOGEL PI IND STRL 8 (GLOVE) ×1 IMPLANT
GLOVE BIOGEL PI IND STRL 9 (GLOVE) ×1 IMPLANT
GLOVE BIOGEL PI INDICATOR 7.0 (GLOVE) ×1
GLOVE BIOGEL PI INDICATOR 8 (GLOVE) ×1
GLOVE BIOGEL PI INDICATOR 9 (GLOVE) ×1
GOWN PREVENTION PLUS XLARGE (GOWN DISPOSABLE) ×2 IMPLANT
GOWN PREVENTION PLUS XXLARGE (GOWN DISPOSABLE) ×2 IMPLANT
IV NS IRRIG 3000ML ARTHROMATIC (IV SOLUTION) IMPLANT
KNEE WRAP E Z 3 GEL PACK (MISCELLANEOUS) ×2 IMPLANT
NDL SAFETY ECLIPSE 18X1.5 (NEEDLE) ×1 IMPLANT
NEEDLE HYPO 18GX1.5 SHARP (NEEDLE) ×1
PACK ARTHROSCOPY DSU (CUSTOM PROCEDURE TRAY) ×2 IMPLANT
PACK BASIN DAY SURGERY FS (CUSTOM PROCEDURE TRAY) ×2 IMPLANT
PAD ALCOHOL SWAB (MISCELLANEOUS) ×2 IMPLANT
PENCIL BUTTON HOLSTER BLD 10FT (ELECTRODE) IMPLANT
SET ARTHROSCOPY TUBING (MISCELLANEOUS) ×1
SET ARTHROSCOPY TUBING LN (MISCELLANEOUS) ×1 IMPLANT
SLEEVE SCD COMPRESS KNEE MED (MISCELLANEOUS) IMPLANT
SPONGE GAUZE 4X4 12PLY (GAUZE/BANDAGES/DRESSINGS) ×2 IMPLANT
SYR 3ML 18GX1 1/2 (SYRINGE) IMPLANT
SYR 5ML LL (SYRINGE) ×2 IMPLANT
TOWEL OR 17X24 6PK STRL BLUE (TOWEL DISPOSABLE) ×2 IMPLANT
WAND STAR VAC 90 (SURGICAL WAND) IMPLANT
WATER STERILE IRR 1000ML POUR (IV SOLUTION) ×2 IMPLANT

## 2013-10-29 NOTE — Op Note (Signed)
Pre-Op Dx: Left knee medial meniscal tear, chondromalacia, possible loose bodies  Postop Dx: Left knee loose bodies x2 and chondromalacia grade 3 flap tears the medial femoral condyle and trochlea   Procedure: Left knee arthroscopic removal of 2 large loose bodies and debridement chondromalacia  Surgeon: Feliberto Gottron. Turner Daniels M.D.  Assist: Tomi Likens. Gaylene Brooks  (present throughout entire procedure and necessary for timely completion of the procedure) Anes: General LMA  EBL: Minimal  Fluids: 800 cc   Indications: Patient has had catching popping and pain with swelling in her left knee pain occasionally wakes her at night and interferes with chores.. Pt has failed conservative treatment with anti-inflammatory medicines, physical therapy, and modified activites but did get good temporarily from an intra-articular cortisone injection. Pain has recurred and patient desires elective arthroscopic evaluation and treatment of knee. Risks and benefits of surgery have been discussed and questions answered.  Procedure: Patient identified by arm band and taken to the operating room at the day [facility]. The appropriate anesthetic monitors were attached, and General LMA anesthesia was induced without difficulty. Lateral post was applied to the table and the lower extremity was prepped and draped in usual sterile fashion from the ankle to the midthigh. Time out procedure was performed. We began the operation by making standard inferior lateral and inferior medial peripatellar portals with a #11 blade allowing introduction of the arthroscope through the inferior lateral portal and the out flow to the inferior medial portal. Pump pressure was set at 100 mmHg and diagnostic arthroscopy  revealed a large loose body in the pouch just below the patella near the notch of the femur. The loose body 1 cm in diameter was then removed using a pituitary Rodgers through an 8 mm extension of the medial parapatellar portal. We also  visualized a second loose body even larger in length that we ultimately removed from the lateral gutter near the bare area of the lateral meniscus using an accessory lateral portal also a millimeters in size. The medial femoral condyle had grade 3 chondromalacia with flap tears abraded back to stable margin it extended over towards the trochlea. The medial meniscus was slightly frayed but overall intact. The patient did have some large anterior tibial osteophytes, the lateral compartment had normal articular and meniscal cartilage. Overall the anterior cruciate ligament was attenuated which probably explained the anterior osteophytes on the tibia. The knee was irrigated out normal saline solution. A dressing of xerofoam 4 x 4 dressing sponges, web roll and an Ace wrap was applied. The patient was awakened extubated and taken to the recovery without difficulty.    Signed: Nestor Lewandowsky, MD

## 2013-10-29 NOTE — Anesthesia Preprocedure Evaluation (Signed)
Anesthesia Evaluation  Patient identified by MRN, date of birth, ID band Patient awake    Reviewed: Allergy & Precautions, H&P , NPO status , Patient's Chart, lab work & pertinent test results  Airway Mallampati: II TM Distance: >3 FB Neck ROM: Full    Dental   Pulmonary asthma ,  breath sounds clear to auscultation        Cardiovascular hypertension, Rhythm:Regular Rate:Normal     Neuro/Psych    GI/Hepatic   Endo/Other  Morbid obesity  Renal/GU      Musculoskeletal   Abdominal (+) + obese,   Peds  Hematology   Anesthesia Other Findings   Reproductive/Obstetrics                           Anesthesia Physical Anesthesia Plan  ASA: II  Anesthesia Plan: General   Post-op Pain Management:    Induction: Intravenous  Airway Management Planned: LMA  Additional Equipment:   Intra-op Plan:   Post-operative Plan: Extubation in OR  Informed Consent: I have reviewed the patients History and Physical, chart, labs and discussed the procedure including the risks, benefits and alternatives for the proposed anesthesia with the patient or authorized representative who has indicated his/her understanding and acceptance.     Plan Discussed with: CRNA and Surgeon  Anesthesia Plan Comments:         Anesthesia Quick Evaluation

## 2013-10-29 NOTE — Interval H&P Note (Signed)
History and Physical Interval Note:  10/29/2013 11:02 AM  [name]  has presented today for surgery, with the diagnosis of LEFT KNEE MEDIAL MENISCUS TEAR  The various methods of treatment have been discussed with the patient and family. After consideration of risks, benefits and other options for treatment, the patient has consented to  Procedure(s): LEFT ARTHROSCOPY KNEE (Left) as a surgical intervention .  The patient's history has been reviewed, patient examined, no change in status, stable for surgery.  I have reviewed the patient's chart and labs.  Questions were answered to the patient's satisfaction.     Nestor Lewandowsky

## 2013-10-29 NOTE — Transfer of Care (Signed)
Immediate Anesthesia Transfer of Care Note  Patient: [name]  Procedure(s) Performed: Procedure(s): LEFT ARTHROSCOPY KNEE (Left)  Patient Location: PACU  Anesthesia Type:General  Level of Consciousness: awake and patient cooperative  Airway & Oxygen Therapy: Patient Spontanous Breathing and Patient connected to face mask oxygen  Post-op Assessment: Report given to PACU RN and Post -op Vital signs reviewed and stable  Post vital signs: Reviewed and stable  Complications: No apparent anesthesia complications

## 2013-10-29 NOTE — Anesthesia Postprocedure Evaluation (Signed)
  Anesthesia Post-op Note  Patient: [name]  Procedure(s) Performed: Procedure(s): LEFT ARTHROSCOPY KNEE (Left)  Patient Location: PACU  Anesthesia Type:General  Level of Consciousness: awake and alert   Airway and Oxygen Therapy: Patient Spontanous Breathing  Post-op Pain: mild  Post-op Assessment: Post-op Vital signs reviewed, Patient's Cardiovascular Status Stable, Respiratory Function Stable, Patent Airway, No signs of Nausea or vomiting and Pain level controlled  Post-op Vital Signs: Reviewed and stable  Complications: No apparent anesthesia complications

## 2013-10-29 NOTE — Anesthesia Procedure Notes (Signed)
Procedure Name: LMA Insertion Date/Time: 10/29/2013 11:21 AM Performed by: Nestor Lewandowsky Pre-anesthesia Checklist: Patient identified, Emergency Drugs available, Suction available and Patient being monitored Patient Re-evaluated:Patient Re-evaluated prior to inductionOxygen Delivery Method: Circle System Utilized Preoxygenation: Pre-oxygenation with 100% oxygen Intubation Type: IV induction Ventilation: Mask ventilation without difficulty LMA: LMA inserted LMA Size: 4.0 Number of attempts: 1 Airway Equipment and Method: bite block Placement Confirmation: positive ETCO2 and breath sounds checked- equal and bilateral Tube secured with: Tape Dental Injury: Teeth and Oropharynx as per pre-operative assessment

## 2013-10-30 ENCOUNTER — Encounter (HOSPITAL_BASED_OUTPATIENT_CLINIC_OR_DEPARTMENT_OTHER): Payer: Self-pay | Admitting: Orthopedic Surgery

## 2013-11-09 ENCOUNTER — Other Ambulatory Visit: Payer: Self-pay

## 2013-11-09 DIAGNOSIS — Z1231 Encounter for screening mammogram for malignant neoplasm of breast: Secondary | ICD-10-CM

## 2013-12-05 ENCOUNTER — Ambulatory Visit
Admission: RE | Admit: 2013-12-05 | Discharge: 2013-12-05 | Disposition: A | Discharge status: Home / Self Care | Payer: BC Managed Care – PPO | Source: Ambulatory Visit

## 2013-12-05 DIAGNOSIS — Z1231 Encounter for screening mammogram for malignant neoplasm of breast: Secondary | ICD-10-CM

## 2013-12-20 ENCOUNTER — Ambulatory Visit (INDEPENDENT_AMBULATORY_CARE_PROVIDER_SITE_OTHER): Payer: BC Managed Care – PPO | Admitting: Podiatrist

## 2013-12-20 ENCOUNTER — Ambulatory Visit (INDEPENDENT_AMBULATORY_CARE_PROVIDER_SITE_OTHER): Payer: BC Managed Care – PPO

## 2013-12-20 ENCOUNTER — Encounter: Payer: Self-pay | Admitting: Podiatrist

## 2013-12-20 VITALS — BP 159/96 | HR 86 | Resp 12

## 2013-12-20 DIAGNOSIS — R52 Pain, unspecified: Secondary | ICD-10-CM

## 2013-12-20 DIAGNOSIS — M659 Synovitis and tenosynovitis, unspecified: Secondary | ICD-10-CM

## 2013-12-20 DIAGNOSIS — M19079 Primary osteoarthritis, unspecified ankle and foot: Secondary | ICD-10-CM

## 2013-12-20 DIAGNOSIS — M7750 Other enthesopathy of unspecified foot: Secondary | ICD-10-CM

## 2013-12-20 DIAGNOSIS — M7752 Other enthesopathy of left foot: Secondary | ICD-10-CM

## 2013-12-20 DIAGNOSIS — M19072 Primary osteoarthritis, left ankle and foot: Secondary | ICD-10-CM

## 2013-12-20 MED ORDER — DICLOFENAC SODIUM 1 % TD GEL: 2.0000 g | Freq: Four times a day (QID) | TRANSDERMAL | Status: AC

## 2013-12-20 NOTE — Patient Instructions (Signed)
If no better, call and we will refer to physical therapy--   [name] is my favorite-- google him

## 2013-12-20 NOTE — Progress Notes (Signed)
   Subjective:    Patient ID: [name], female    DOB: 1961-02-25, 53 y.o.   MRN: [id number]  HPI PT STATED LT FOOT AND ANKLE IS BEEN SORE AND HAVE BURNING SENSATION SINCE  AFTER THE KNEE SURGERY IN 3 MONTHS AND ITS GETTING WORSE. TREATMENT TRIED ADVIL AS NEEDED.    Review of Systems  All other systems reviewed and are negative.       Objective:   Physical Exam GENERAL APPEARANCE: Alert, conversant. Appropriately groomed. No acute distress.  VASCULAR: Pedal pulses palpable at 2/4 DP and PT bilateral.  Capillary refill time is immediate to all digits,  Proximal to distal cooling it warm to warm.  Digital hair growth is present bilateral  NEUROLOGIC: sensation is intact epicritically and protectively to 5.07 monofilament at 5/5 sites bilateral.  Light touch is intact bilateral, vibratory sensation intact bilateral, achilles tendon reflex is intact bilateral.  MUSCULOSKELETAL: pain along the lateral and anterolateral ankle is present with direct pressure and with lateral inversion stress.  No anterior drawer sign present.  Discomfort at the atfl and cfl is present along with peroneal tendon discomfort DERMATOLOGIC: skin color, texture, and turger are within normal limits.  No preulcerative lesions are seen, no interdigital maceration noted.  No open lesions present.  Digital nails are asymptomatic.     Assessment & Plan:   ankle sprain  Compensation s/p knee surgery  Recommended an ankle brace and strengthening ankle exercises.  Recommended physical therapy if there is minimal improvement in 2 weeks.

## 2014-06-20 ENCOUNTER — Other Ambulatory Visit: Payer: Self-pay | Admitting: Obstetrics and Gynecology

## 2014-06-20 ENCOUNTER — Other Ambulatory Visit (HOSPITAL_COMMUNITY)
Admission: RE | Admit: 2014-06-20 | Discharge: 2014-06-20 | Disposition: A | Discharge status: Home / Self Care | Payer: BC Managed Care – PPO | Source: Ambulatory Visit | Attending: Obstetrics and Gynecology | Admitting: Obstetrics and Gynecology

## 2014-06-20 DIAGNOSIS — Z01419 Encounter for gynecological examination (general) (routine) without abnormal findings: Secondary | ICD-10-CM | POA: Insufficient documentation

## 2014-06-21 LAB — CYTOLOGY - PAP

## 2015-01-21 IMAGING — CT CT ABD-PELV W/ CM
1 of 3 series · 14 of 32 positions shown, 19 images · IV contrast (OMNIPAQUE 300)
Comparison: Ultrasound abdomen 11/27/2012.  CT abdomen and pelvis
01/26/2011

CLINICAL DATA: Lower abdominal pain.

CT ABDOMEN AND PELVIS WITH CONTRAST
TECHNIQUE: Multidetector CT imaging of the abdomen and pelvis was
performed following the standard protocol during bolus
administration of intravenous contrast.
Contrast: 50mL OMNIPAQUE IOHEXOL 300 MG/ML  SOLN, 100mL OMNIPAQUE
IOHEXOL 300 MG/ML  SOLN

[Series 2: abd/pel with · axial · 0.80mm/px · z∈[+912,+1312]mm · 14 of 90 slices shown, 19 images]
[im 5/90  soft-tissue]
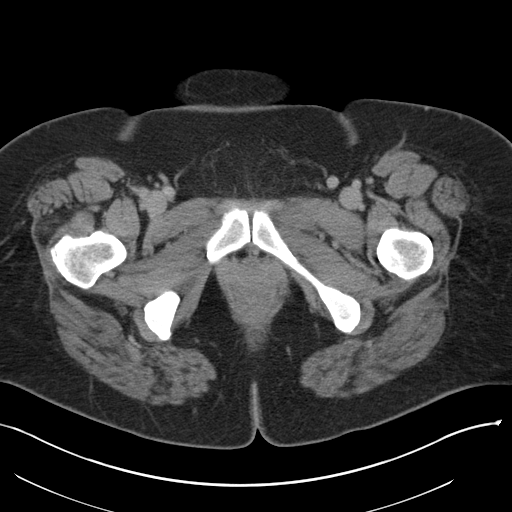
[im 5/90  bone]
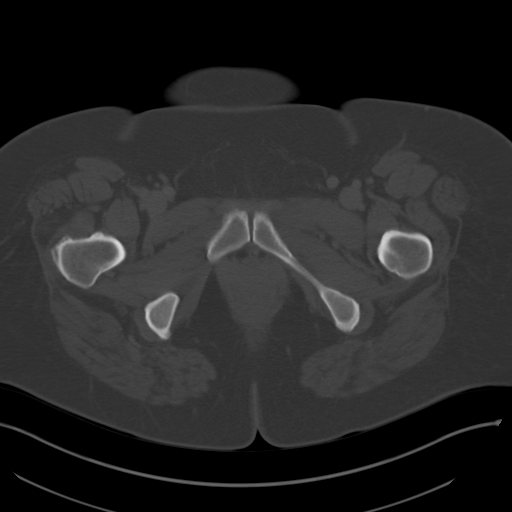
[im 15/90  soft-tissue]
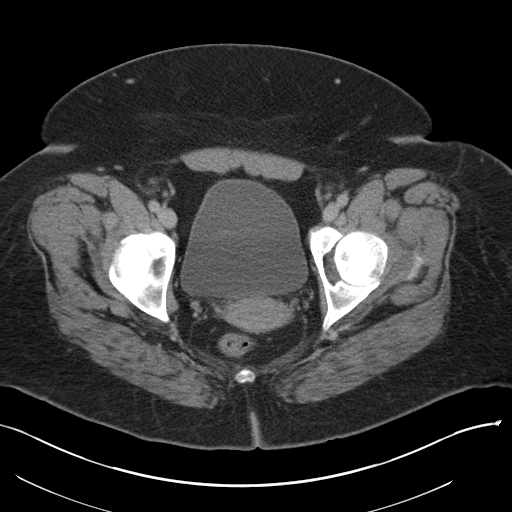
[im 19/90  soft-tissue]
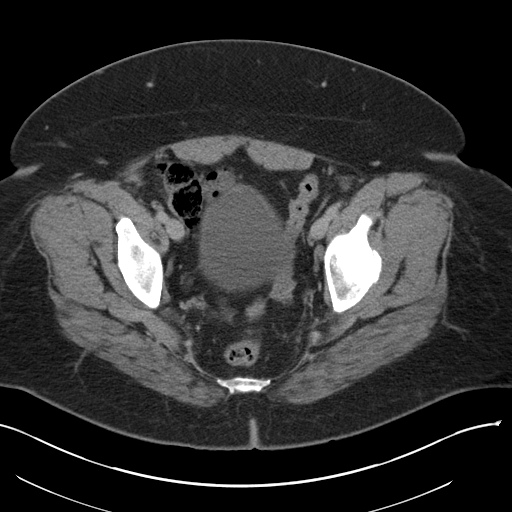
[im 24/90  soft-tissue]
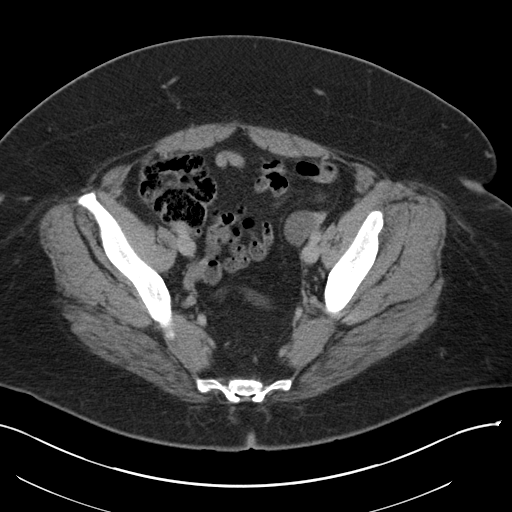
[im 33/90  soft-tissue]
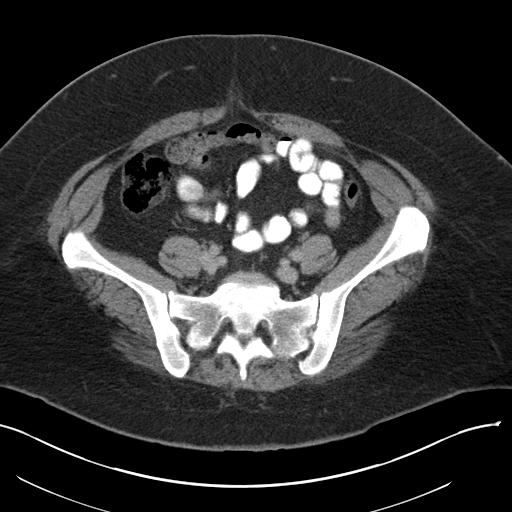
[im 38/90  soft-tissue]
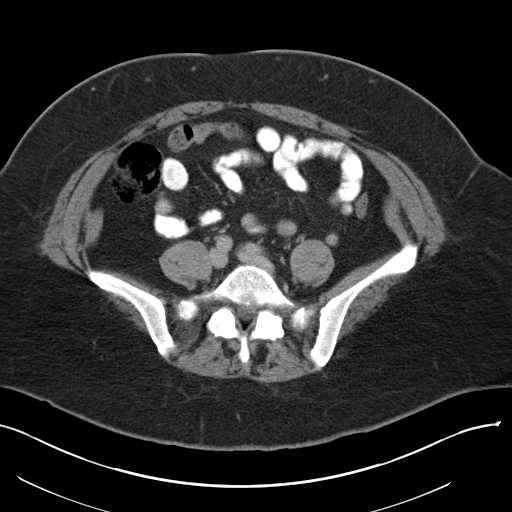
[im 47/90  soft-tissue]
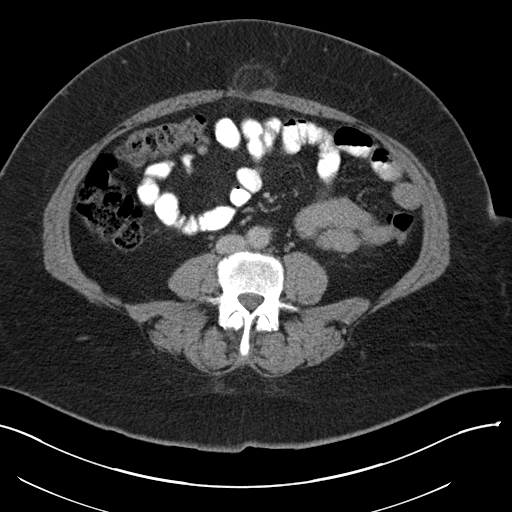
[im 52/90  soft-tissue]
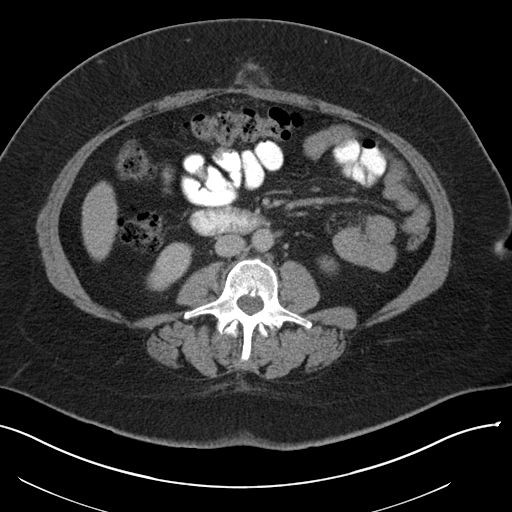
[im 57/90  soft-tissue]
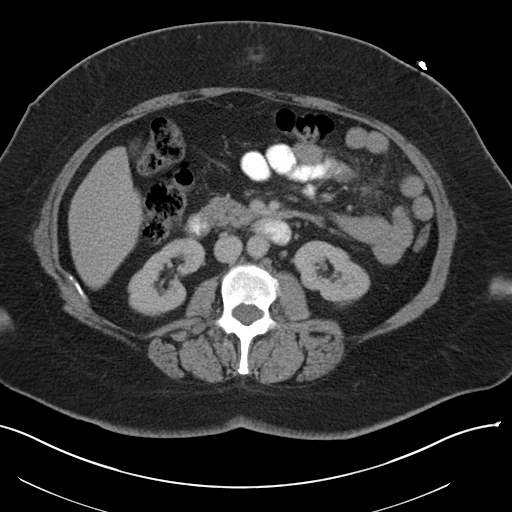
[im 57/90  bone]
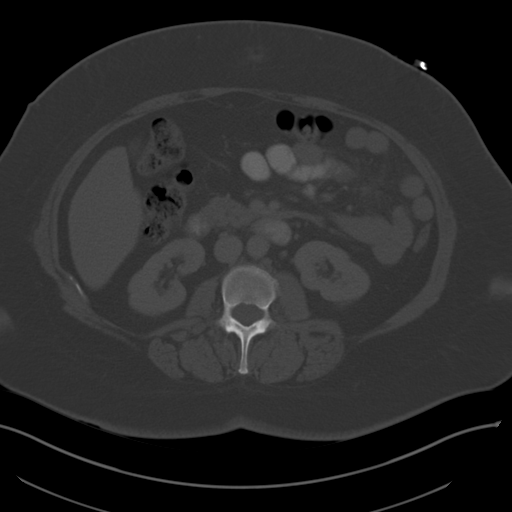
[im 66/90  soft-tissue]
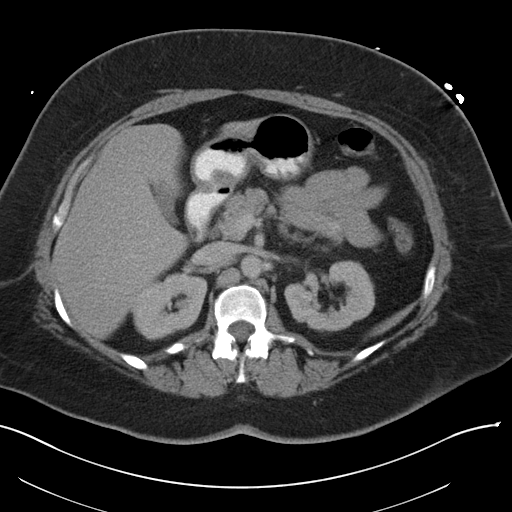
[im 71/90  soft-tissue]
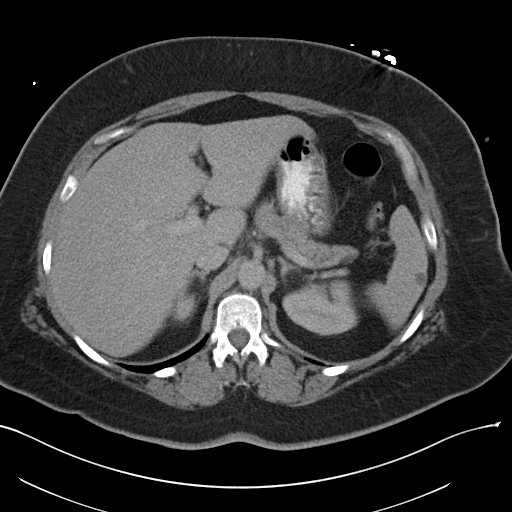
[im 71/90  lung]
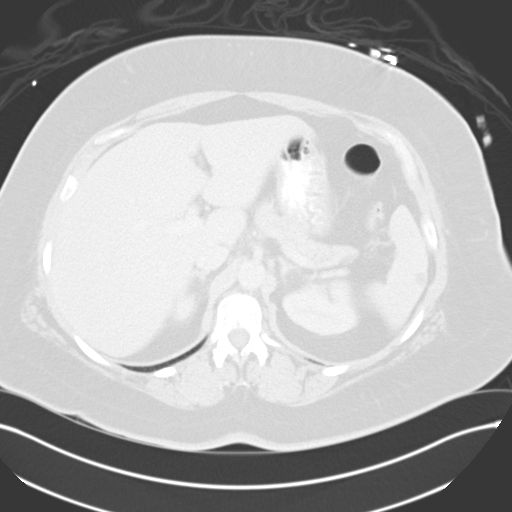
[im 75/90  soft-tissue]
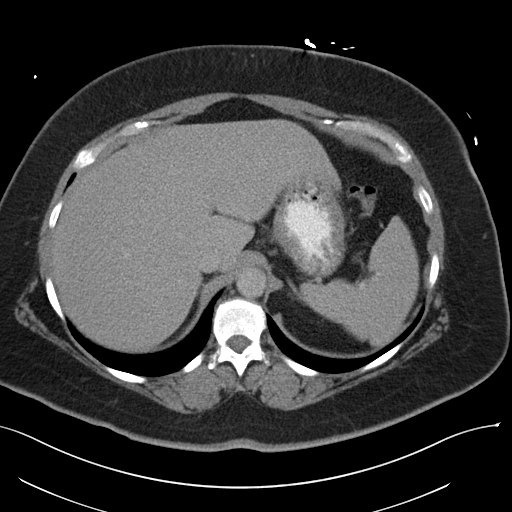
[im 75/90  lung]
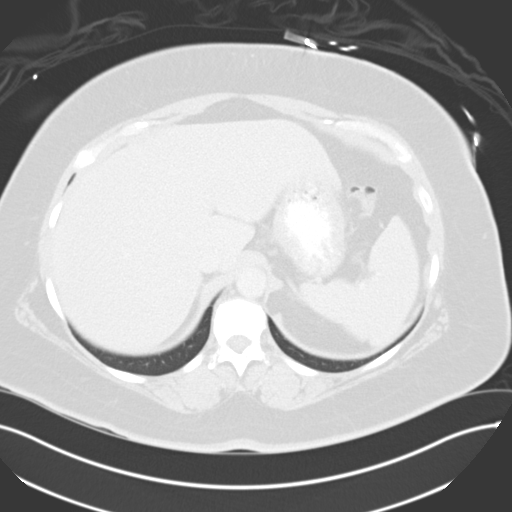
[im 80/90  lung]
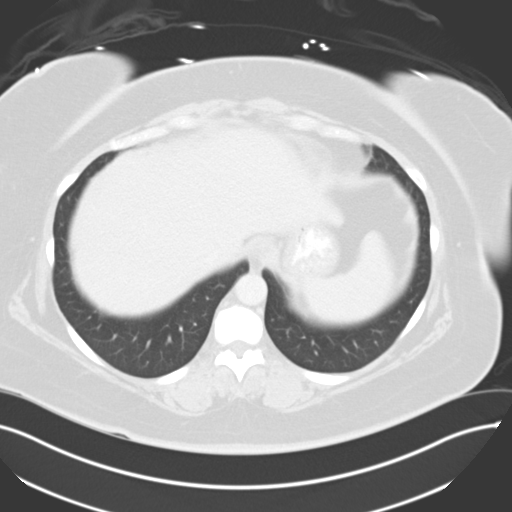
[im 85/90  soft-tissue]
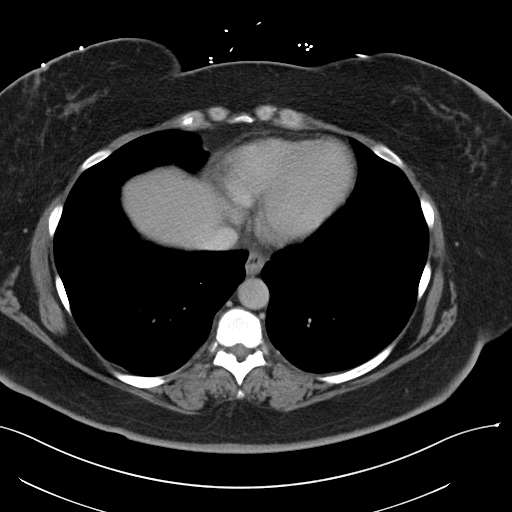
[im 85/90  lung]
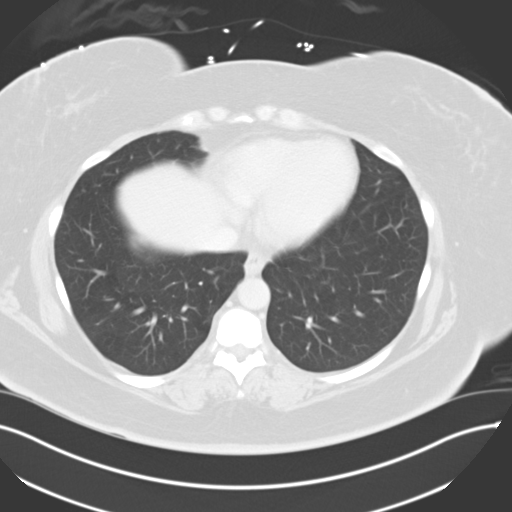

[14 of 32 positions shown; findings below may reference images not displayed]

FINDINGS: The lung bases are clear.

Poor contrast bolus limits visualization of the vascular structures
and solid organs on the portal venous phase images.  There is a 6
mm low attenuation structure in the spleen which appears to been
present previously and probably represents a cyst or hemangioma.
The liver, gallbladder, pancreas, adrenal glands, kidneys,
abdominal aorta, and retroperitoneal lymph nodes are unremarkable.
There is a small midline supraumbilical abdominal wall hernia
containing fat.  This is new since the previous study.  The
stomach, small bowel, and colon are not abnormally distended.  No
free air or free fluid in the abdomen.  There is hazy infiltration
in the left mid abdominal mesentery with small lymph nodes present
in this area.  This could represent inflammatory or infiltrative
process such as fat necrosis or mesenteritis.

Pelvis:  There appears to be a partially uterus, suggesting
possible prior partial hysterectomy.  No abnormal adnexal masses.
Ovaries are not enlarged.  The bladder wall is not thickened.  No
free or loculated pelvic fluid collections.  The appendix is
normal.  No evidence of diverticulitis.  No significant pelvic
lymphadenopathy.  Degenerative changes in the lumbar spine.  No
destructive bone lesions appreciated.  The
IMPRESSION: New small midline abdominal wall hernia containing fat.
Infiltration and small lymph nodes present in the left mid
abdominal mesentery could represent fat necrosis or mesenteritis.
No focal abscess or bowel wall thickening.

## 2017-10-08 DEATH — deceased
